# Patient Record
Sex: Female | Born: 2000
Health system: Southern US, Community
[De-identification: ages and names within clinical notes are randomized; demographics above are authoritative.]

## PROBLEM LIST (undated history)

## (undated) DIAGNOSIS — N390 Urinary tract infection, site not specified: Secondary | ICD-10-CM

## (undated) DIAGNOSIS — N926 Irregular menstruation, unspecified: Secondary | ICD-10-CM

## (undated) DIAGNOSIS — F419 Anxiety disorder, unspecified: Secondary | ICD-10-CM

## (undated) DIAGNOSIS — G43109 Migraine with aura, not intractable, without status migrainosus: Secondary | ICD-10-CM

## (undated) DIAGNOSIS — F32A Depression, unspecified: Secondary | ICD-10-CM

## (undated) HISTORY — PX: WISDOM TOOTH EXTRACTION: SHX21

## (undated) HISTORY — DX: Anxiety disorder, unspecified: F41.9

## (undated) HISTORY — DX: Irregular menstruation, unspecified: N92.6

## (undated) HISTORY — DX: Migraine with aura, not intractable, without status migrainosus: G43.109

## (undated) HISTORY — DX: Urinary tract infection, site not specified: N39.0

## (undated) HISTORY — DX: Depression, unspecified: F32.A

---
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
## 2021-01-01 ENCOUNTER — Ambulatory Visit (INDEPENDENT_AMBULATORY_CARE_PROVIDER_SITE_OTHER): Payer: 59 | Admitting: Obstetrics and Gynecology

## 2021-01-01 ENCOUNTER — Encounter: Payer: Self-pay | Admitting: Obstetrics and Gynecology

## 2021-01-01 ENCOUNTER — Other Ambulatory Visit: Payer: Self-pay

## 2021-01-01 VITALS — BP 108/80 | Ht 65.0 in | Wt 133.0 lb

## 2021-01-01 DIAGNOSIS — Z3009 Encounter for other general counseling and advice on contraception: Secondary | ICD-10-CM | POA: Diagnosis not present

## 2021-01-01 DIAGNOSIS — R350 Frequency of micturition: Secondary | ICD-10-CM | POA: Diagnosis not present

## 2021-01-01 LAB — POCT URINALYSIS DIPSTICK
Bilirubin, UA: NEGATIVE
Blood, UA: NEGATIVE
Glucose, UA: NEGATIVE
Ketones, UA: NEGATIVE
Leukocytes, UA: NEGATIVE
Nitrite, UA: NEGATIVE
Protein, UA: NEGATIVE
Spec Grav, UA: 1.02 (ref 1.010–1.025)
pH, UA: 6 (ref 5.0–8.0)

## 2021-01-04 LAB — URINE CULTURE

## 2021-01-05 MED ORDER — NITROFURANTOIN MONOHYD MACRO 100 MG PO CAPS: 100.0000 mg | ORAL_CAPSULE | Freq: Two times a day (BID) | ORAL | 0 refills | Status: AC

## 2021-01-05 NOTE — Progress Notes (Signed)
Pls let pt know C&S shows UTI, Rx macrobid eRxd. Thx.

## 2021-01-05 NOTE — Progress Notes (Signed)
Pt aware.

## 2021-01-05 NOTE — Progress Notes (Signed)
Called pt, no answer, LVMTRC. 

## 2021-01-05 NOTE — Addendum Note (Signed)
Addended by: Althea Grimmer B on: 01/05/2021 01:58 PM   Modules accepted: Orders

## 2021-12-06 ENCOUNTER — Other Ambulatory Visit: Payer: Self-pay

## 2021-12-06 ENCOUNTER — Encounter: Payer: Self-pay | Admitting: Obstetrics and Gynecology

## 2021-12-06 ENCOUNTER — Other Ambulatory Visit (HOSPITAL_COMMUNITY)
Admission: RE | Admit: 2021-12-06 | Discharge: 2021-12-06 | Disposition: A | Discharge status: Home / Self Care | Payer: 59 | Source: Ambulatory Visit | Attending: Obstetrics and Gynecology | Admitting: Obstetrics and Gynecology

## 2021-12-06 ENCOUNTER — Ambulatory Visit (INDEPENDENT_AMBULATORY_CARE_PROVIDER_SITE_OTHER): Payer: 59 | Admitting: Obstetrics and Gynecology

## 2021-12-06 VITALS — BP 100/60 | Ht 65.0 in | Wt 136.0 lb

## 2021-12-06 DIAGNOSIS — N3001 Acute cystitis with hematuria: Secondary | ICD-10-CM

## 2021-12-06 DIAGNOSIS — Z113 Encounter for screening for infections with a predominantly sexual mode of transmission: Secondary | ICD-10-CM

## 2021-12-06 DIAGNOSIS — R102 Pelvic and perineal pain: Secondary | ICD-10-CM

## 2021-12-06 DIAGNOSIS — Z30011 Encounter for initial prescription of contraceptive pills: Secondary | ICD-10-CM

## 2021-12-06 LAB — POCT URINALYSIS DIPSTICK
Bilirubin, UA: NEGATIVE
Glucose, UA: NEGATIVE
Ketones, UA: NEGATIVE
Nitrite, UA: NEGATIVE
Protein, UA: NEGATIVE
Spec Grav, UA: 1.02 (ref 1.010–1.025)
pH, UA: 5 (ref 5.0–8.0)

## 2021-12-06 MED ORDER — NITROFURANTOIN MONOHYD MACRO 100 MG PO CAPS: 100.0000 mg | ORAL_CAPSULE | Freq: Two times a day (BID) | ORAL | 0 refills | Status: AC

## 2021-12-06 MED ORDER — NORETHINDRONE 0.35 MG PO TABS: 1.0000 | ORAL_TABLET | Freq: Every day | ORAL | 3 refills | Status: DC

## 2021-12-06 NOTE — Progress Notes (Signed)
? ? ?[facility], [facility] ? ? ?Chief Complaint  ?Patient presents with  ? Pelvic Pain  ?  Some pain in pelvic, mainly abdominal started Monday  ? Urinary Tract Infection  ?  Urgency/Incontinence x 2 weeks, no burning.  ? ? ?HPI: ?     Ms. Jamie Collins is a 21 y.o. G0P0000 whose LMP was Patient's last menstrual period was 12/03/2021 (exact date)., presents today for severe pelvic pain that started 3 days ago with her period. Pain was  stabbing, constant, not improved with NSAIDs/heating pad, but is improved today. Never had this before with menses. Pain was in suprapubic area and bilat flanks radiating to BLQ. No fever/chills. Hx of chronic LBP but this was different. Pt also with urinary urgency/frequency with some incontinence for past 2 wks, worse now. No dysuria, no hematuria prior to menses starting. No vag sx. Drinking a lot of caffeine. Hx of UTIs, with E coli 3/22 on C&S.  ?She is sex active, no pain/bleeding. No BC, no condoms. Did OCPs in past with mood changes and longer periods. Didn't like nexplanon. Has hx of migraines with aura, no hx of HTN, DVTs. Is interested in [facility] Due for STD testing.  ? ?Past Medical History:  ?Diagnosis Date  ? Anxiety   ? Depression   ? Irregular menses   ? Migraine with aura   ? UTI (urinary tract infection)   ? ? ?Past Surgical History:  ?Procedure Laterality Date  ? WISDOM TOOTH EXTRACTION    ? ? ?Family History  ?Problem Relation Age of Onset  ? Diabetes Paternal Grandfather   ? Cancer Maternal Great-grandmother   ?     not sure type of cancer  ? ? ?Social History  ? ?Socioeconomic History  ? Marital status: Single  ?  Spouse name: Not on file  ? Number of children: Not on file  ? Years of education: Not on file  ? Highest education level: Not on file  ?Occupational History  ? Not on file  ?Tobacco Use  ? Smoking status: Never  ? Smokeless tobacco: Never  ?Vaping Use  ? Vaping Use: Never used  ?Substance and Sexual Activity  ? Alcohol use: No  ? Drug use: No  ?  Sexual activity: Yes  ?  Birth control/protection: None  ?Other Topics Concern  ? Not on file  ?Social History Narrative  ? Not on file  ? ?Social Determinants of Health  ? ?Financial Resource Strain: Not on file  ?Food Insecurity: Not on file  ?Transportation Needs: Not on file  ?Physical Activity: Not on file  ?Stress: Not on file  ?Social Connections: Not on file  ?Intimate Partner Violence: Not on file  ? ? ?Outpatient Medications Prior to Visit  ?Medication Sig Dispense Refill  ? buPROPion (WELLBUTRIN XL) 300 MG 24 hr tablet Take 300 mg by mouth daily.    ? meloxicam (MOBIC) 15 MG tablet Take 15 mg by mouth daily.    ? ?No facility-administered medications prior to visit.  ? ? ? ? ?ROS: ? ?Review of Systems  ?Constitutional:  Negative for fever.  ?Gastrointestinal:  Negative for blood in stool, constipation, diarrhea, nausea and vomiting.  ?Genitourinary:  Positive for flank pain, frequency, pelvic pain and urgency. Negative for dyspareunia, dysuria, hematuria, vaginal bleeding, vaginal discharge and vaginal pain.  ?Musculoskeletal:  Positive for back pain.  ?Skin:  Negative for rash.  ?BREAST: No symptoms ? ? ?OBJECTIVE:  ? ?Vitals:  ?BP 100/60   Ht 5'  5" (1.651 m)   Wt 136 lb (61.7 kg)   LMP 12/03/2021 (Exact Date)   BMI 22.63 kg/m?  ? ?Physical Exam ?Vitals reviewed.  ?Constitutional:   ?   Appearance: She is well-developed. She is not ill-appearing or toxic-appearing.  ?Pulmonary:  ?   Effort: Pulmonary effort is normal.  ?Abdominal:  ?   Palpations: Abdomen is soft.  ?   Tenderness: There is no abdominal tenderness. There is no right CVA tenderness or left CVA tenderness.  ?Genitourinary: ?   General: Normal vulva.  ?   Pubic Area: No rash.   ?   Labia:     ?   Right: No rash, tenderness or lesion.     ?   Left: No rash, tenderness or lesion.   ?   Vagina: Normal. No vaginal discharge, erythema or tenderness.  ?   Cervix: No cervical motion tenderness.  ?   Uterus: Normal. Not enlarged and not tender.    ?   Adnexa: Right adnexa normal and left adnexa normal.    ?   Right: No mass or tenderness.      ?   Left: No mass or tenderness.    ?Musculoskeletal:     ?   General: Normal range of motion.  ?   Cervical back: Normal range of motion.  ?Skin: ?   General: Skin is warm and dry.  ?Neurological:  ?   General: No focal deficit present.  ?   Mental Status: She is alert and oriented to person, place, and time.  ?   Cranial Nerves: No cranial nerve deficit.  ?Psychiatric:     ?   Mood and Affect: Mood normal.     ?   Behavior: Behavior normal.     ?   Thought Content: Thought content normal.     ?   Judgment: Judgment normal.  ? ? ?Results: ?Results for orders placed or performed in visit on 12/06/21 (from the past 24 hour(s))  ?POCT Urinalysis Dipstick     Status: Abnormal  ? Collection Time: 12/06/21 10:49 AM  ?Result Value Ref Range  ? Color, UA yellow   ? Clarity, UA cloudy   ? Glucose, UA Negative Negative  ? Bilirubin, UA neg   ? Ketones, UA neg   ? Spec Grav, UA 1.020 1.010 - 1.025  ? Blood, UA large   ? pH, UA 5.0 5.0 - 8.0  ? Protein, UA Negative Negative  ? Urobilinogen, UA    ? Nitrite, UA neg   ? Leukocytes, UA Large (3+) (A) Negative  ? Appearance    ? Odor    ? ?PT WITH MENSTRUAL BLEEDING BUT VERY FAINT ON EXAM; HEMATURIA PROB UTI RELATED ? ?Assessment/Plan: ?Pelvic pain - Plan: Urine Culture; with menses, sx improving. Neg exam. Rule out STDs, check C&S, treat for UTI. If neg and sx persist/recur, will check GYN u/s.  ? ?Screening for STD (sexually transmitted disease) - Plan: Cervicovaginal ancillary only ? ?Acute cystitis with hematuria - Plan: nitrofurantoin, macrocrystal-monohydrate, (MACROBID) 100 MG capsule, POCT Urinalysis Dipstick, Urine Culture; pos sx and UA. Check C&S. Rx macrobid. D/C caffeine, f/u prn.  ? ?Encounter for initial prescription of contraceptive pills - Plan: norethindrone (MICRONOR) 0.35 MG tablet; prog only options discussed. Pt would like to try POPs. Rx eRxd, condoms for 1 mo  and it more than 3 hrs late taking Rx.  ? ? ?Meds ordered this encounter  ?Medications  ? nitrofurantoin, macrocrystal-monohydrate, (MACROBID) 100  MG capsule  ?  Sig: Take 1 capsule (100 mg total) by mouth 2 (two) times daily for 5 days.  ?  Dispense:  10 capsule  ?  Refill:  0  ?  Order Specific Question:   Supervising Provider  ?  AnswerNadara Mustard [[id number]]  ? norethindrone (MICRONOR) 0.35 MG tablet  ?  Sig: Take 1 tablet (0.35 mg total) by mouth daily.  ?  Dispense:  84 tablet  ?  Refill:  3  ?  Order Specific Question:   Supervising Provider  ?  AnswerNadara Mustard [[id number]]  ? ? ? ? Return if symptoms worsen or fail to improve. ? ?Severin Bou B. Daron Breeding, PA-C ?12/06/2021 ?10:53 AM ? ? ? ? ? ?

## 2021-12-06 NOTE — Patient Instructions (Signed)
I value your feedback and you entrusting us with your care. If you get a [facility] patient survey, I would appreciate you taking the time to let us know about your experience today. Thank you! ? ? ?

## 2021-12-07 LAB — CERVICOVAGINAL ANCILLARY ONLY
Chlamydia: NEGATIVE
Comment: NEGATIVE
Comment: NORMAL
Neisseria Gonorrhea: NEGATIVE

## 2021-12-10 LAB — URINE CULTURE

## 2022-10-03 DIAGNOSIS — Z3200 Encounter for pregnancy test, result unknown: Secondary | ICD-10-CM | POA: Insufficient documentation

## 2022-10-03 NOTE — Progress Notes (Cosign Needed)
    NURSE VISIT NOTE  Subjective:    Patient ID: Jamie Collins, female    DOB: [birth year], 21 y.o.   MRN: [id number]  HPI  Patient is a 21 y.o. G0P0000 female who presents for evaluation of amenorrhea. She believes she could be pregnant. Current symptoms also include: nausea. Last period was abnormal spoting 08/16/2022 but unsure     Objective:    BP 117/78   Pulse 82   Ht 5\' 5"  (1.651 m)   Wt 133 lb (60.3 kg)   LMP 08/16/2022   BMI 22.13 kg/m   Lab Review  Results for orders placed or performed in visit on 10/04/22  POCT urine pregnancy  Result Value Ref Range   Preg Test, Ur Positive (A) Negative    Assessment:   1. Possible pregnancy, not confirmed     Plan:   Pregnancy Test: Positive      10/06/22, CMA

## 2022-10-04 ENCOUNTER — Ambulatory Visit (INDEPENDENT_AMBULATORY_CARE_PROVIDER_SITE_OTHER): Payer: Self-pay

## 2022-10-04 VITALS — BP 117/78 | HR 82 | Ht 65.0 in | Wt 133.0 lb

## 2022-10-04 DIAGNOSIS — Z3201 Encounter for pregnancy test, result positive: Secondary | ICD-10-CM

## 2022-10-04 DIAGNOSIS — Z3200 Encounter for pregnancy test, result unknown: Secondary | ICD-10-CM

## 2022-10-04 LAB — POCT URINE PREGNANCY: Preg Test, Ur: POSITIVE — AB

## 2022-10-07 NOTE — L&D Delivery Note (Signed)
Delivery Note  Jamie Collins is a G1P1001 at [redacted]w[redacted]d with an LMP of 08/16/22, consistent with [place] at [redacted]w[redacted]d.   First Stage: Labor onset: 1352 Induction: misoprostol, oxytocin, AROM, and cervical balloon Analgesia /Anesthesia intrapartum: Epidural AROM at 1452 GBS: negative IP Antibiotics: none  Second Stage: Complete dilation at 2316 Onset of pushing at 2326 FHR second stage 125 bpm with moderate variability, variable decels with pushing   Jamie Collins presented to L&D with elective IOL. She was 1.5/50--3. She progressed  to C/C/+2 with a spontaneous urge to push.  She pushed  effectively over approximately 20 minutes for a spontaneous vaginal birth.  Delivery of a viable baby boy on 05/24/23 at 2346 by CNM Delivery of fetal head in OA position with restitution to LOT. no nuchal cord;  Anterior then posterior shoulders delivered easily with gentle downward traction. Baby placed on mom's chest, and attended to by baby RN Cord double clamped after cessation of pulsation, cut by FOB  Cord blood sample collection: Yes O POS Performed at [facility], [address] [place], [place] [address]  Collection of cord blood donation n/a Arterial cord blood sample none  Third Stage: Oxytocin bolus started after delivery of infant for hemorrhage prophylaxis  Placenta delivered [name] intact with 3 VC @ 2354 Placenta disposition: discarded per protocol Uterine tone firm / bleeding moderate  2 nd vaginal, 1st bilateral labial laceration identified  Anesthesia for repair: epidural Repair 2-0, 3-0 vicryl Est. Blood Loss (mL): 300  Complications: none  Mom to postpartum.  Baby to Couplet care / Skin to Skin.  Newborn: Information for the patient's newborn:  [name], [name] [[id number]]  Live born female "Jamie Collins" Birth Weight:   APGAR: 9, 9  Newborn Delivery   Birth date/time: 05/24/2023 23:46:00 Delivery type: Vaginal, Spontaneous      Feeding planned: breast  feeding  ---------- Chari Manning, CNM Certified Nurse Midwife [facility]

## 2022-10-09 ENCOUNTER — Ambulatory Visit: Payer: Self-pay

## 2022-10-10 ENCOUNTER — Telehealth: Payer: Self-pay

## 2022-10-10 NOTE — Telephone Encounter (Signed)
Pt calling; has NOB Intake appt tomorrow; knows she needs to pay up front; wants to know how much she will need to pay?  [phone]

## 2022-10-10 NOTE — Telephone Encounter (Signed)
Touched base with pt to tell her a msg was sent about up front cost but haven't received a response yet; tomorrow's appt will be 'no charge'.  Pt appreciative.

## 2022-10-11 ENCOUNTER — Ambulatory Visit (INDEPENDENT_AMBULATORY_CARE_PROVIDER_SITE_OTHER): Payer: Self-pay

## 2022-10-11 VITALS — Wt 133.0 lb

## 2022-10-11 DIAGNOSIS — Z369 Encounter for antenatal screening, unspecified: Secondary | ICD-10-CM

## 2022-10-11 DIAGNOSIS — Z3689 Encounter for other specified antenatal screening: Secondary | ICD-10-CM

## 2022-10-11 DIAGNOSIS — Z3480 Encounter for supervision of other normal pregnancy, unspecified trimester: Secondary | ICD-10-CM | POA: Insufficient documentation

## 2022-10-11 NOTE — Progress Notes (Signed)
New OB Intake  I connected with  Jamie Collins on 10/11/22 at 11:15 AM EST by telephone and verified that I am speaking with the correct person using two identifiers. Nurse is located at [employer] and pt is located at home.  I explained I am completing New OB Intake today. We discussed her EDD of 05/23/2023 that is based on LMP of 08/16/2022. Pt is G1/P0. I reviewed her allergies, medications, Medical/Surgical/OB history, and appropriate screenings. There are cats in the home  no.  Based on history, this is a/an pregnancy uncomplicated .   Patient Active Problem List   Diagnosis Date Noted   Supervision of other normal pregnancy, antepartum 10/11/2022   Possible pregnancy, not confirmed 10/03/2022    Concerns addressed today Questions answered to pt satisfaction regarding hair color, exercise, baths, safe meds.   Delivery Plans:  Plans to deliver at  [facility].  Anatomy US Explained first scheduled [place] will be 1/31st and an anatomy scan will be done at 20 weeks.  Labs Discussed genetic screening with patient. Patient desires genetic testing to be drawn with new OB labs. Discussed possible labs to be drawn at new OB appointment.  COVID Vaccine Patient has had COVID vaccine.   Social Determinants of Health Food Insecurity: denies food insecurity Transportation: Patient denies transportation needs.  First visit review I reviewed new OB appt with pt. I explained she will have ob bloodwork and pap smear/pelvic exam if indicated. Explained pt will be seen by Dr. Mayo Ao at first visit; encounter routed to appropriate provider.   Cleophas Dunker, [place] 10/11/2022  11:45 AM

## 2022-10-11 NOTE — Patient Instructions (Signed)
First Trimester of Pregnancy  The first trimester of pregnancy starts on the first day of your last menstrual period until the end of week 12. This is also called months 1 through 3 of pregnancy. Body changes during your first trimester Your body goes through many changes during pregnancy. The changes usually return to normal after your baby is born. Physical changes You may gain or lose weight. Your breasts may grow larger and hurt. The area around your nipples may get darker. Dark spots or blotches may develop on your face. You may have changes in your hair. Health changes You may feel like you might vomit (nauseous), and you may vomit. You may have heartburn. You may have headaches. You may have trouble pooping (constipation). Your gums may bleed. Other changes You may get tired easily. You may pee (urinate) more often. Your menstrual periods will stop. You may not feel hungry. You may want to eat certain kinds of food. You may have changes in your emotions from day to day. You may have more dreams. Follow these instructions at home: Medicines Take over-the-counter and prescription medicines only as told by your doctor. Some medicines are not safe during pregnancy. Take a prenatal vitamin that contains at least 600 micrograms (mcg) of folic acid. Eating and drinking Eat healthy meals that include: Fresh fruits and vegetables. Whole grains. Good sources of protein, such as meat, eggs, or tofu. Low-fat dairy products. Avoid raw meat and unpasteurized juice, milk, and cheese. If you feel like you may vomit, or you vomit: Eat 4 or 5 small meals a day instead of 3 large meals. Try eating a few soda crackers. Drink liquids between meals instead of during meals. You may need to take these actions to prevent or treat trouble pooping: Drink enough fluids to keep your pee (urine) pale yellow. Eat foods that are high in fiber. These include beans, whole grains, and fresh fruits and  vegetables. Limit foods that are high in fat and sugar. These include fried or sweet foods. Activity Exercise only as told by your doctor. Most people can do their usual exercise routine during pregnancy. Stop exercising if you have cramps or pain in your lower belly (abdomen) or low back. Do not exercise if it is too hot or too humid, or if you are in a place of great height (high altitude). Avoid heavy lifting. If you choose to, you may have sex unless your doctor tells you not to. Relieving pain and discomfort Wear a good support bra if your breasts are sore. Rest with your legs raised (elevated) if you have leg cramps or low back pain. If you have bulging veins (varicose veins) in your legs: Wear support hose as told by your doctor. Raise your feet for 15 minutes, 3-4 times a day. Limit salt in your food. Safety Wear your seat belt at all times when you are in a car. Talk with your doctor if someone is hurting you or yelling at you. Talk with your doctor if you are feeling sad or have thoughts of hurting yourself. Lifestyle Do not use hot tubs, steam rooms, or saunas. Do not douche. Do not use tampons or scented sanitary pads. Do not use herbal medicines, illegal drugs, or medicines that are not approved by your doctor. Do not drink alcohol. Do not smoke or use any products that contain nicotine or tobacco. If you need help quitting, ask your doctor. Avoid cat litter boxes and soil that is used by cats. These carry   germs that can cause harm to the baby and can cause a loss of your baby by miscarriage or stillbirth. General instructions Keep all follow-up visits. This is important. Ask for help if you need counseling or if you need help with nutrition. Your doctor can give you advice or tell you where to go for help. Visit your dentist. At home, brush your teeth with a soft toothbrush. Floss gently. Write down your questions. Take them to your prenatal visits. Where to find more  information American Pregnancy Association: americanpregnancy.org American College of Obstetricians and Gynecologists: www.acog.org Office on Women's Health: womenshealth.gov/pregnancy Contact a doctor if: You are dizzy. You have a fever. You have mild cramps or pressure in your lower belly. You have a nagging pain in your belly area. You continue to feel like you may vomit, you vomit, or you have watery poop (diarrhea) for 24 hours or longer. You have a bad-smelling fluid coming from your vagina. You have pain when you pee. You are exposed to a disease that spreads from person to person, such as chickenpox, measles, Zika virus, HIV, or hepatitis. Get help right away if: You have spotting or bleeding from your vagina. You have very bad belly cramping or pain. You have shortness of breath or chest pain. You have any kind of injury, such as from a fall or a car crash. You have new or increased pain, swelling, or redness in an arm or leg. Summary The first trimester of pregnancy starts on the first day of your last menstrual period until the end of week 12 (months 1 through 3). Eat 4 or 5 small meals a day instead of 3 large meals. Do not smoke or use any products that contain nicotine or tobacco. If you need help quitting, ask your doctor. Keep all follow-up visits. This information is not intended to replace advice given to you by your health care provider. Make sure you discuss any questions you have with your health care provider. Document Revised: 03/01/2020 Document Reviewed: 01/06/2020 Elsevier Patient Education  2023 Elsevier Inc. Commonly Asked Questions During Pregnancy  Cats: A parasite can be excreted in cat feces.  To avoid exposure you need to have another person empty the little box.  If you must empty the litter box you will need to wear gloves.  Wash your hands after handling your cat.  This parasite can also be found in raw or undercooked meat so this should also be  avoided.  Colds, Sore Throats, Flu: Please check your medication sheet to see what you can take for symptoms.  If your symptoms are unrelieved by these medications please call the office.  Dental Work: Most any dental work your dentist recommends is permitted.  X-rays should only be taken during the first trimester if absolutely necessary.  Your abdomen should be shielded with a lead apron during all x-rays.  Please notify your provider prior to receiving any x-rays.  Novocaine is fine; gas is not recommended.  If your dentist requires a note from us prior to dental work please call the office and we will provide one for you.  Exercise: Exercise is an important part of staying healthy during your pregnancy.  You may continue most exercises you were accustomed to prior to pregnancy.  Later in your pregnancy you will most likely notice you have difficulty with activities requiring balance like riding a bicycle.  It is important that you listen to your body and avoid activities that put you at a higher   risk of falling.  Adequate rest and staying well hydrated are a must!  If you have questions about the safety of specific activities ask your provider.    Exposure to Children with illness: Try to avoid obvious exposure; report any symptoms to us when noted,  If you have chicken pos, red measles or mumps, you should be immune to these diseases.   Please do not take any vaccines while pregnant unless you have checked with your OB provider.  Fetal Movement: After 28 weeks we recommend you do "kick counts" twice daily.  Lie or sit down in a calm quiet environment and count your baby movements "kicks".  You should feel your baby at least 10 times per hour.  If you have not felt 10 kicks within the first hour get up, walk around and have something sweet to eat or drink then repeat for an additional hour.  If count remains less than 10 per hour notify your provider.  Fumigating: Follow your pest control agent's  advice as to how long to stay out of your home.  Ventilate the area well before re-entering.  Hemorrhoids:   Most over-the-counter preparations can be used during pregnancy.  Check your medication to see what is safe to use.  It is important to use a stool softener or fiber in your diet and to drink lots of liquids.  If hemorrhoids seem to be getting worse please call the office.   Hot Tubs:  Hot tubs Jacuzzis and saunas are not recommended while pregnant.  These increase your internal body temperature and should be avoided.  Intercourse:  Sexual intercourse is safe during pregnancy as long as you are comfortable, unless otherwise advised by your provider.  Spotting may occur after intercourse; report any bright red bleeding that is heavier than spotting.  Labor:  If you know that you are in labor, please go to the hospital.  If you are unsure, please call the office and let us help you decide what to do.  Lifting, straining, etc:  If your job requires heavy lifting or straining please check with your provider for any limitations.  Generally, you should not lift items heavier than that you can lift simply with your hands and arms (no back muscles)  Painting:  Paint fumes do not harm your pregnancy, but may make you ill and should be avoided if possible.  Latex or water based paints have less odor than oils.  Use adequate ventilation while painting.  Permanents & Hair Color:  Chemicals in hair dyes are not recommended as they cause increase hair dryness which can increase hair loss during pregnancy.  " Highlighting" and permanents are allowed.  Dye may be absorbed differently and permanents may not hold as well during pregnancy.  Sunbathing:  Use a sunscreen, as skin burns easily during pregnancy.  Drink plenty of fluids; avoid over heating.  Tanning Beds:  Because their possible side effects are still unknown, tanning beds are not recommended.  Ultrasound Scans:  Routine ultrasounds are performed  at approximately 20 weeks.  You will be able to see your baby's general anatomy an if you would like to know the gender this can usually be determined as well.  If it is questionable when you conceived you may also receive an ultrasound early in your pregnancy for dating purposes.  Otherwise ultrasound exams are not routinely performed unless there is a medical necessity.  Although you can request a scan we ask that you pay for it when   conducted because insurance does not cover " patient request" scans.  Work: If your pregnancy proceeds without complications you may work until your due date, unless your physician or employer advises otherwise.  Round Ligament Pain/Pelvic Discomfort:  Sharp, shooting pains not associated with bleeding are fairly common, usually occurring in the second trimester of pregnancy.  They tend to be worse when standing up or when you remain standing for long periods of time.  These are the result of pressure of certain pelvic ligaments called "round ligaments".  Rest, Tylenol and heat seem to be the most effective relief.  As the womb and fetus grow, they rise out of the pelvis and the discomfort improves.  Please notify the office if your pain seems different than that described.  It may represent a more serious condition.  Common Medications Safe in Pregnancy  Acne:      Constipation:  Benzoyl Peroxide     Colace  Clindamycin      Dulcolax Suppository  Topica Erythromycin     Fibercon  Salicylic Acid      Metamucil         Miralax AVOID:        Senakot   Accutane    Cough:  Retin-A       Cough Drops  Tetracycline      Phenergan w/ Codeine if Rx  Minocycline      Robitussin (Plain & DM)  Antibiotics:     Crabs/Lice:  Ceclor       RID  Cephalosporins    AVOID:  E-Mycins      Kwell  Keflex  Macrobid/Macrodantin   Diarrhea:  Penicillin      Kao-Pectate  Zithromax      Imodium AD         PUSH FLUIDS AVOID:       Cipro     Fever:  Tetracycline      Tylenol (Regular  or Extra  Minocycline       Strength)  Levaquin      Extra Strength-Do not          Exceed 8 tabs/24 hrs Caffeine:        <200mg/day (equiv. To 1 cup of coffee or  approx. 3 12 oz sodas)         Gas: Cold/Hayfever:       Gas-X  Benadryl      Mylicon  Claritin       Phazyme  **Claritin-D        Chlor-Trimeton    Headaches:  Dimetapp      ASA-Free Excedrin  Drixoral-Non-Drowsy     Cold Compress  Mucinex (Guaifenasin)     Tylenol (Regular or Extra  Sudafed/Sudafed-12 Hour     Strength)  **Sudafed PE Pseudoephedrine   Tylenol Cold & Sinus     Vicks Vapor Rub  Zyrtec  **AVOID if Problems With Blood Pressure         Heartburn: Avoid lying down for at least 1 hour after meals  Aciphex      Maalox     Rash:  Milk of Magnesia     Benadryl    Mylanta       1% Hydrocortisone Cream  Pepcid  Pepcid Complete   Sleep Aids:  Prevacid      Ambien   Prilosec       Benadryl  Rolaids       Chamomile Tea  Tums (Limit 4/day)     Unisom           Tylenol PM         Warm milk-add vanilla or  Hemorrhoids:       Sugar for taste  Anusol/Anusol H.C.  (RX: Analapram 2.5%)  Sugar Substitutes:  Hydrocortisone OTC     Ok in moderation  Preparation H      Tucks        Vaseline lotion applied to tissue with wiping    Herpes:     Throat:  Acyclovir      Oragel  Famvir  Valtrex     Vaccines:         Flu Shot Leg Cramps:       *Gardasil  Benadryl      Hepatitis A         Hepatitis B Nasal Spray:       Pneumovax  Saline Nasal Spray     Polio Booster         Tetanus Nausea:       Tuberculosis test or PPD  Vitamin B6 25 mg TID   AVOID:    Dramamine      *Gardasil  Emetrol       Live Poliovirus  Ginger Root 250 mg QID    MMR (measles, mumps &  High Complex Carbs @ Bedtime    rebella)  Sea Bands-Accupressure    Varicella (Chickenpox)  Unisom 1/2 tab TID     *No known complications           If received before Pain:         Known pregnancy;   Darvocet       Resume series  after  Lortab        Delivery  Percocet    Yeast:   Tramadol      Femstat  Tylenol 3      Gyne-lotrimin  Ultram       Monistat  Vicodin           MISC:         All Sunscreens           Hair Coloring/highlights          Insect Repellant's          (Including DEET)         Mystic Tans  

## 2022-10-15 ENCOUNTER — Telehealth: Payer: Self-pay

## 2022-10-15 NOTE — Telephone Encounter (Signed)
Pt calling; is having stomach pain; what to do?  [phone]  Pt states pain started late last night; couldn't eat, no cramping or bleeding; hurts to move; sort of around belly button.  Adv may take two e.s. tylenol q6h while awake and apply heat 20 minutes out of each hour while awake. If pain gets worse to be seen. Pt appreciative.

## 2022-10-22 ENCOUNTER — Telehealth: Payer: Self-pay

## 2022-10-22 MED ORDER — DOXYLAMINE-PYRIDOXINE 10-10 MG PO TBEC: 2.0000 | DELAYED_RELEASE_TABLET | ORAL | 1 refills | Status: AC

## 2022-10-22 NOTE — Telephone Encounter (Signed)
Pt calling; her nausea is worse; has tried the suggestions from the nurse; can something be rx'd?  [phone]  Adv pt will rx diglegis.

## 2022-10-23 NOTE — Telephone Encounter (Signed)
Pt calling; has picked up the medication but is still very nauseas today; would it be possible to get a doctor's note for being out of work yesterday and today?  [phone]

## 2022-11-06 ENCOUNTER — Other Ambulatory Visit: Payer: Self-pay

## 2022-11-07 ENCOUNTER — Ambulatory Visit
Admission: RE | Admit: 2022-11-07 | Discharge: 2022-11-07 | Disposition: A | Discharge status: Home / Self Care | Payer: BC Managed Care – PPO | Source: Ambulatory Visit | Attending: Obstetrics and Gynecology | Admitting: Obstetrics and Gynecology

## 2022-11-07 ENCOUNTER — Other Ambulatory Visit (HOSPITAL_COMMUNITY)
Admission: RE | Admit: 2022-11-07 | Discharge: 2022-11-07 | Disposition: A | Discharge status: Home / Self Care | Payer: BC Managed Care – PPO | Source: Ambulatory Visit | Attending: Obstetrics and Gynecology | Admitting: Obstetrics and Gynecology

## 2022-11-07 ENCOUNTER — Other Ambulatory Visit: Payer: Self-pay

## 2022-11-07 DIAGNOSIS — Z3481 Encounter for supervision of other normal pregnancy, first trimester: Secondary | ICD-10-CM | POA: Diagnosis not present

## 2022-11-07 DIAGNOSIS — Z369 Encounter for antenatal screening, unspecified: Secondary | ICD-10-CM | POA: Insufficient documentation

## 2022-11-07 DIAGNOSIS — Z3480 Encounter for supervision of other normal pregnancy, unspecified trimester: Secondary | ICD-10-CM

## 2022-11-07 DIAGNOSIS — Z3A12 12 weeks gestation of pregnancy: Secondary | ICD-10-CM | POA: Insufficient documentation

## 2022-11-08 LAB — CBC/D/PLT+RPR+RH+ABO+RUBIGG...
Antibody Screen: NEGATIVE
Basophils Absolute: 0.1 10*3/uL (ref 0.0–0.2)
Basos: 1 %
EOS (ABSOLUTE): 0.1 10*3/uL (ref 0.0–0.4)
Eos: 1 %
HCV Ab: NONREACTIVE
HIV Screen 4th Generation wRfx: NONREACTIVE
Hematocrit: 38.1 % (ref 34.0–46.6)
Hemoglobin: 13.3 g/dL (ref 11.1–15.9)
Hepatitis B Surface Ag: NEGATIVE
Immature Grans (Abs): 0.1 10*3/uL (ref 0.0–0.1)
Immature Granulocytes: 1 %
Lymphocytes Absolute: 1.3 10*3/uL (ref 0.7–3.1)
Lymphs: 12 %
MCH: 32 pg (ref 26.6–33.0)
MCHC: 34.9 g/dL (ref 31.5–35.7)
MCV: 92 fL (ref 79–97)
Monocytes Absolute: 0.8 10*3/uL (ref 0.1–0.9)
Monocytes: 7 %
Neutrophils Absolute: 8.5 10*3/uL — ABNORMAL HIGH (ref 1.4–7.0)
Neutrophils: 78 %
Platelets: 267 10*3/uL (ref 150–450)
RBC: 4.16 x10E6/uL (ref 3.77–5.28)
RDW: 14.3 % (ref 11.7–15.4)
RPR Ser Ql: NONREACTIVE
Rh Factor: POSITIVE
Rubella Antibodies, IGG: 4.15 index (ref >0.99)
Varicella zoster IgG: 439 index (ref >165)
WBC: 10.8 10*3/uL (ref 3.4–10.8)

## 2022-11-08 LAB — URINALYSIS, ROUTINE W REFLEX MICROSCOPIC
Bilirubin, UA: NEGATIVE
Glucose, UA: NEGATIVE
Ketones, UA: NEGATIVE
Nitrite, UA: NEGATIVE
Protein,UA: NEGATIVE
RBC, UA: NEGATIVE
Specific Gravity, UA: 1.01 (ref 1.005–1.030)
Urobilinogen, Ur: 0.2 mg/dL (ref 0.2–1.0)
pH, UA: 6 (ref 5.0–7.5)

## 2022-11-08 LAB — MICROSCOPIC EXAMINATION
Bacteria, UA: NONE SEEN
Casts: NONE SEEN /lpf
RBC, Urine: NONE SEEN /hpf (ref 0–2)

## 2022-11-08 LAB — HCV INTERPRETATION

## 2022-11-09 LAB — MONITOR DRUG PROFILE 14(MW)
Amphetamine Scrn, Ur: NEGATIVE ng/mL
BARBITURATE SCREEN URINE: NEGATIVE ng/mL
BENZODIAZEPINE SCREEN, URINE: NEGATIVE ng/mL
Buprenorphine, Urine: NEGATIVE ng/mL
CANNABINOIDS UR QL SCN: NEGATIVE ng/mL
Cocaine (Metab) Scrn, Ur: NEGATIVE ng/mL
Creatinine(Crt), U: 47.5 mg/dL (ref 20.0–300.0)
Fentanyl, Urine: NEGATIVE pg/mL
Meperidine Screen, Urine: NEGATIVE ng/mL
Methadone Screen, Urine: NEGATIVE ng/mL
OXYCODONE+OXYMORPHONE UR QL SCN: NEGATIVE ng/mL
Opiate Scrn, Ur: NEGATIVE ng/mL
Ph of Urine: 5.4 (ref 4.5–8.9)
Phencyclidine Qn, Ur: NEGATIVE ng/mL
Propoxyphene Scrn, Ur: NEGATIVE ng/mL
SPECIFIC GRAVITY: 1.01
Tramadol Screen, Urine: NEGATIVE ng/mL

## 2022-11-09 LAB — URINE CULTURE, OB REFLEX

## 2022-11-09 LAB — NICOTINE SCREEN, URINE: Cotinine Ql Scrn, Ur: NEGATIVE ng/mL

## 2022-11-09 LAB — CULTURE, OB URINE

## 2022-11-11 LAB — URINE CYTOLOGY ANCILLARY ONLY
Chlamydia: NEGATIVE
Comment: NEGATIVE
Comment: NORMAL
Neisseria Gonorrhea: NEGATIVE

## 2022-11-12 LAB — MATERNIT 21 PLUS CORE, BLOOD
Fetal Fraction: 4
Result (T21): NEGATIVE
Trisomy 13 (Patau syndrome): NEGATIVE
Trisomy 18 (Edwards syndrome): NEGATIVE
Trisomy 21 (Down syndrome): NEGATIVE

## 2022-11-19 ENCOUNTER — Encounter: Payer: Self-pay | Admitting: Obstetrics and Gynecology

## 2022-11-19 ENCOUNTER — Ambulatory Visit (INDEPENDENT_AMBULATORY_CARE_PROVIDER_SITE_OTHER): Payer: BC Managed Care – PPO | Admitting: Obstetrics and Gynecology

## 2022-11-19 VITALS — BP 112/66 | HR 82 | Wt 135.1 lb

## 2022-11-19 DIAGNOSIS — R82998 Other abnormal findings in urine: Secondary | ICD-10-CM

## 2022-11-19 DIAGNOSIS — Z3A13 13 weeks gestation of pregnancy: Secondary | ICD-10-CM | POA: Diagnosis not present

## 2022-11-19 DIAGNOSIS — Z3401 Encounter for supervision of normal first pregnancy, first trimester: Secondary | ICD-10-CM

## 2022-11-19 DIAGNOSIS — R399 Unspecified symptoms and signs involving the genitourinary system: Secondary | ICD-10-CM

## 2022-11-19 DIAGNOSIS — Z3402 Encounter for supervision of normal first pregnancy, second trimester: Secondary | ICD-10-CM

## 2022-11-19 LAB — POCT URINALYSIS DIPSTICK OB
Bilirubin, UA: NEGATIVE
Glucose, UA: NEGATIVE
Ketones, UA: NEGATIVE
Spec Grav, UA: 1.015
Urobilinogen, UA: 0.2 U/dL
pH, UA: 7.5

## 2022-11-19 MED ORDER — NITROFURANTOIN MONOHYD MACRO 100 MG PO CAPS: 100.0000 mg | ORAL_CAPSULE | Freq: Two times a day (BID) | ORAL | 0 refills | Status: DC

## 2022-11-19 NOTE — Progress Notes (Signed)
NOB: She has occasional low back pain that she believes is from a previous sports injury.  Has no urinary symptoms but urine obviously with UTI.  Macrobid prescribed.  Has occasional nausea but she states this has dramatically improved.  Currently using B6. Consider AFP next visit -schedule anatomy ultrasound next visit for approximately 20 weeks.  Physical examination General NAD, Conversant  HEENT Atraumatic; Op clear with mmm.  Normo-cephalic. Pupils reactive. Anicteric sclerae  Thyroid/Neck Smooth without nodularity or enlargement. Normal ROM.  Neck Supple.  Skin No rashes, lesions or ulceration. Normal palpated skin turgor. No nodularity.  Breasts: No masses or discharge.  Symmetric.  No axillary adenopathy.  Lungs: Clear to auscultation.No rales or wheezes. Normal Respiratory effort, no retractions.  Heart: NSR.  No murmurs or rubs appreciated. No peripheral edema  Abdomen: Soft.  Non-tender.  No masses.  No HSM. No hernia  Extremities: Moves all appropriately.  Normal ROM for age. No lymphadenopathy.  Neuro: Oriented to PPT.  Normal mood. Normal affect.     Pelvic:   Vulva: Normal appearance.  No lesions.  Vagina: No lesions or abnormalities noted.  Support: Normal pelvic support.  Urethra No masses tenderness or scarring.  Meatus Normal size without lesions or prolapse.  Cervix: Normal appearance.  No lesions.  Anus: Normal exam.  No lesions.  Perineum: Normal exam.  No lesions.        Bimanual   Adnexae: No masses.  Non-tender to palpation.  Uterus: Enlarged. 13 wks  Non-tender.  Mobile.  AV.  Adnexae: No masses.  Non-tender to palpation.  Cul-de-sac: Negative for abnormality.  Adnexae: No masses.  Non-tender to palpation.         Pelvimetry   Diagonal: Reached.  Spines: Average.  Sacrum: Concave.  Pubic Arch: Normal.

## 2022-11-19 NOTE — Progress Notes (Signed)
Patient presents today for New OB physical. She states continuing to have nausea on and off, handout given with tips. She does state some back and side pain. Patient had genetic testing, Materniti21 negative. She states no other questions or concerns at this time.

## 2022-11-27 ENCOUNTER — Telehealth: Payer: Self-pay

## 2022-11-27 NOTE — Telephone Encounter (Signed)
Patient is 90w5dpregnant. She just started a new job at [employer] She is required to have Flu vaccine and TB Skin test. Employer requesting letter of approval for both due to patient being pregnant. Patient will receive letter thru my chart.

## 2022-12-04 DIAGNOSIS — Z3493 Encounter for supervision of normal pregnancy, unspecified, third trimester: Secondary | ICD-10-CM | POA: Insufficient documentation

## 2022-12-16 ENCOUNTER — Encounter: Payer: Medicaid Other | Admitting: Licensed Practical Nurse

## 2023-02-02 ENCOUNTER — Other Ambulatory Visit: Payer: Self-pay

## 2023-02-02 ENCOUNTER — Encounter: Payer: Self-pay | Admitting: Obstetrics and Gynecology

## 2023-02-02 ENCOUNTER — Observation Stay: Payer: Medicaid Other

## 2023-02-02 ENCOUNTER — Observation Stay
Admission: EM | Admit: 2023-02-02 | Discharge: 2023-02-02 | Disposition: A | Discharge status: Home / Self Care | Payer: Medicaid Other | Attending: Obstetrics and Gynecology | Admitting: Obstetrics and Gynecology

## 2023-02-02 DIAGNOSIS — O26852 Spotting complicating pregnancy, second trimester: Principal | ICD-10-CM | POA: Insufficient documentation

## 2023-02-02 DIAGNOSIS — O4692 Antepartum hemorrhage, unspecified, second trimester: Secondary | ICD-10-CM | POA: Diagnosis present

## 2023-02-02 DIAGNOSIS — Z3A24 24 weeks gestation of pregnancy: Secondary | ICD-10-CM | POA: Diagnosis not present

## 2023-02-02 LAB — URINALYSIS, COMPLETE (UACMP) WITH MICROSCOPIC
Bilirubin Urine: NEGATIVE
Glucose, UA: NEGATIVE mg/dL
Hgb urine dipstick: NEGATIVE
Ketones, ur: NEGATIVE mg/dL
Nitrite: NEGATIVE
Protein, ur: NEGATIVE mg/dL
Specific Gravity, Urine: 1.006 (ref 1.005–1.030)
pH: 8 (ref 5.0–8.0)

## 2023-02-02 LAB — WET PREP, GENITAL
Clue Cells Wet Prep HPF POC: NONE SEEN
Sperm: NONE SEEN
Trich, Wet Prep: NONE SEEN
WBC, Wet Prep HPF POC: >=10 — AB (ref <10)
Yeast Wet Prep HPF POC: NONE SEEN

## 2023-02-02 LAB — CHLAMYDIA/NGC RT PCR (ARMC ONLY)
Chlamydia Tr: NOT DETECTED
N gonorrhoeae: NOT DETECTED

## 2023-02-02 MED ORDER — ACETAMINOPHEN 500 MG PO TABS: 1000.0000 mg | ORAL_TABLET | Freq: Four times a day (QID) | ORAL | Status: DC | PRN

## 2023-02-02 MED ORDER — CALCIUM CARBONATE ANTACID 500 MG PO CHEW: 2.0000 | CHEWABLE_TABLET | ORAL | Status: DC | PRN

## 2023-02-02 NOTE — OB Triage Note (Signed)

## 2023-02-02 NOTE — OB Triage Note (Signed)
Pt Jamie Collins 22 y.o. presents to labor and delivery triage reporting spotting. 2 weeks ago she had spotting with pain. Friday, she passed a small clot. She noticed some bright red and brown blood since that time. The bleeding is described as spotting. Pt doesn't feel the need to wear a panty-liner. The cramping started to get worse today. She has had some intermittent cramping rating 6/10 at the worst. Pt is a G1P0000 at [redacted]w[redacted]d . Pt denies signs and symptons consistent with rupture of membranes or active vaginal bleeding. Pt denies contractions and states positive fetal movement. TOCO applied to non-tender abdomen and assessing. Doppler of FHR is 155. Vital signs obtained and within normal limits. Provider notified of pt.

## 2023-02-02 NOTE — Discharge Summary (Signed)
Milda Lindvall is a 22 y.o. female. She is at [redacted]w[redacted]d gestation. Patient's last menstrual period was 08/16/2022 (exact date). 05/23/2023, by Last Menstrual Period   Prenatal care site: [facility]  Chief complaint: spotting and cramping   HPI: Jesyka presents to L&D with complaints of vaginal spotting.  She reports intermittent spotting that started on Friday.  She's had 5 episodes of light pink spotting, mostly when she wipes.  Reports intermittent cramping that got worse today.  She was seen 2 weeks ago for similar symptoms, states cramping is not as severe as it was then.  Denies any recent intercourse.  Denies LOF.  States she's felt movement since coming to [facility] triage.    S: Resting comfortably. no CTX, no VB.no LOF,  Active fetal movement.   Maternal Medical History:  Past Medical Hx:  has a past medical history of Anxiety, Depression, Irregular menses, Migraine with aura, and UTI (urinary tract infection).    Past Surgical Hx:  has a past surgical history that includes Wisdom tooth extraction.   Allergies  Allergen Reactions   Other Other (See Comments)    CHERRIES. CHERRIES - GI Upset   Penicillins Hives     Prior to Admission medications   Medication Sig Start Date End Date Taking? Authorizing Provider  escitalopram (LEXAPRO) 10 MG tablet Take 10 mg by mouth daily.   Yes [provider]  ondansetron (ZOFRAN-ODT) 4 MG disintegrating tablet Take 4 mg by mouth every 8 (eight) hours as needed for nausea or vomiting.   Yes [provider]  nitrofurantoin, macrocrystal-monohydrate, (MACROBID) 100 MG capsule Take 1 capsule (100 mg total) by mouth 2 (two) times daily. 11/19/22   Linzie Collin, MD  Prenatal Vit-Fe Fumarate-FA (MULTIVITAMIN-PRENATAL) 27-0.8 MG TABS tablet Take 1 tablet by mouth daily at 12 noon.    [provider]  Pyridoxine HCl (B-6 PO) Take by mouth.    [provider]    Social History: She  reports that she has never  smoked. She has never used smokeless tobacco. She reports that she does not drink alcohol and does not use drugs.  Family History: family history includes Cancer (age of onset: 67) in her maternal great-grandmother; Diabetes in her paternal grandfather; Healthy in her brother, father, maternal grandfather, maternal grandmother, mother, paternal grandmother, sister, sister, sister, sister, and sister.   Review of Systems: A full review of systems was performed and negative except as noted in the HPI.     Pertinent Results:  Prenatal Labs: Blood type/Rh O positive   Antibody screen Negative    Rubella 4.15 (02/01 1500)   Varicella Immune  RPR Non Reactive (02/01 1500)   HBsAg Negative (02/01 1500)  Hep C NR   HIV Non Reactive (02/01 1500)   GC neg  Chlamydia neg  Genetic screening cfDNA negative/AFP neg    O:  BP 121/74 (BP Location: Left Arm)   Pulse 79   Temp 97.9 F (36.6 C) (Oral)   Resp 16   Ht 5\' 5"  (1.651 m)   Wt 64 kg   LMP 08/16/2022 (Exact Date)   BMI 23.48 kg/m  Results for orders placed or performed during the hospital encounter of 02/02/23 (from the past 48 hour(s))  Urinalysis, Complete w Microscopic -Urine, Clean Catch   Collection Time: 02/02/23 11:10 AM  Result Value Ref Range   Color, Urine YELLOW (A) YELLOW   APPearance CLEAR (A) CLEAR   Specific Gravity, Urine 1.006 1.005 - 1.030  pH 8.0 5.0 - 8.0   Glucose, UA NEGATIVE NEGATIVE mg/dL   Hgb urine dipstick NEGATIVE NEGATIVE   Bilirubin Urine NEGATIVE NEGATIVE   Ketones, ur NEGATIVE NEGATIVE mg/dL   Protein, ur NEGATIVE NEGATIVE mg/dL   Nitrite NEGATIVE NEGATIVE   Leukocytes,Ua SMALL (A) NEGATIVE   RBC / HPF 0-5 0 - 5 RBC/hpf   WBC, UA 11-20 0 - 5 WBC/hpf   Bacteria, UA MANY (A) NONE SEEN   Squamous Epithelial / HPF 0-5 0 - 5 /HPF   Mucus PRESENT   Wet prep, genital   Collection Time: 02/02/23 12:45 PM  Result Value Ref Range   Yeast Wet Prep HPF POC NONE SEEN NONE SEEN   Trich, Wet Prep NONE  SEEN NONE SEEN   Clue Cells Wet Prep HPF POC NONE SEEN NONE SEEN   WBC, Wet Prep HPF POC >=10 (A) <10   Sperm NONE SEEN      US OB Limited 02/02/2023  Narrative CLINICAL DATA:  Pregnant patient with bleeding  EXAM: LIMITED OBSTETRIC ULTRASOUND  COMPARISON:  None Available.  FINDINGS: Number of Fetuses: 1 Heart Rate:  141 bpm Movement: Yes Presentation: Breech Placental Location: Posterior Previa: No. The lower edge of the placenta is 4.8 cm superior to the internal cervical os. Amniotic Fluid (Subjective):  Within normal limits.  BPD: 5.37 cm 22 w  2 d  MATERNAL FINDINGS: Cervix:  Appears closed.  Approximately 2.85 cm Uterus/Adnexae: No abnormality visualized.  IMPRESSION: Single live IUP.  No cause for bleeding identified.  This exam is performed on an emergent basis and does not comprehensively evaluate fetal size, dating, or anatomy; follow-up complete OB US should be considered if further fetal assessment is warranted.  Electronically Signed By: Gerome Sam III M.D. On: 02/02/2023 12:17   Constitutional: NAD, AAOx3  HE/ENT: extraocular movements grossly intact, moist mucous membranes CV: RRR PULM: nl respiratory effort Abd: gravid, non-tender, non-distended, soft  Ext: Non-tender, Nonedmeatous Psych: mood appropriate, speech normal Pelvic : moderate amount of physiologic discharge  Cervix: Visually closed, long, no active bleeding    Fetal monitoring  Doppler: 150 bpm differentiated from maternal heart beat  Tocometry: occasional mild contractions   Assessment: 22 y.o. G1P0000 [redacted]w[redacted]d 05/23/2023, by Last Menstrual Period   Principle diagnosis: Vaginal bleeding in pregnancy, second trimester [O46.92]   Plan: 1) Fetal status  -FHR appropriate for gestational age  -Reassuring fetal status   2) Preterm labor - not present  -Cervical length reassuring -Wet prep negative for vaginal infections  -Urine culture pending  -No ongoing contractions or  vagina bleeding  -Discussed warning signs to return to L&D triage with  -Reviewed comfort measures   3) Disposition: discharge home stable -Precautions reviewed  -Follow up this week with prenatal provider   ----- Margaretmary Eddy, CNM Certified Nurse Midwife [facility]

## 2023-02-03 LAB — URINE CULTURE: Culture: >=100000 — AB

## 2023-02-04 LAB — URINE CULTURE

## 2023-03-27 ENCOUNTER — Encounter: Payer: Self-pay | Admitting: Obstetrics and Gynecology

## 2023-03-27 ENCOUNTER — Observation Stay
Admission: EM | Admit: 2023-03-27 | Discharge: 2023-03-27 | Disposition: A | Discharge status: Home / Self Care | Payer: Managed Care, Other (non HMO) | Attending: Obstetrics | Admitting: Obstetrics

## 2023-03-27 ENCOUNTER — Other Ambulatory Visit: Payer: Self-pay

## 2023-03-27 DIAGNOSIS — Z79899 Other long term (current) drug therapy: Secondary | ICD-10-CM | POA: Insufficient documentation

## 2023-03-27 DIAGNOSIS — Z3A31 31 weeks gestation of pregnancy: Secondary | ICD-10-CM | POA: Diagnosis not present

## 2023-03-27 DIAGNOSIS — R319 Hematuria, unspecified: Secondary | ICD-10-CM | POA: Insufficient documentation

## 2023-03-27 DIAGNOSIS — Z3480 Encounter for supervision of other normal pregnancy, unspecified trimester: Secondary | ICD-10-CM

## 2023-03-27 DIAGNOSIS — O26893 Other specified pregnancy related conditions, third trimester: Principal | ICD-10-CM | POA: Insufficient documentation

## 2023-03-27 DIAGNOSIS — O26853 Spotting complicating pregnancy, third trimester: Secondary | ICD-10-CM | POA: Insufficient documentation

## 2023-03-27 DIAGNOSIS — O4690 Antepartum hemorrhage, unspecified, unspecified trimester: Secondary | ICD-10-CM | POA: Diagnosis present

## 2023-03-27 LAB — URINALYSIS, COMPLETE (UACMP) WITH MICROSCOPIC
Bilirubin Urine: NEGATIVE
Glucose, UA: NEGATIVE mg/dL
Ketones, ur: NEGATIVE mg/dL
Nitrite: NEGATIVE
Protein, ur: NEGATIVE mg/dL
Specific Gravity, Urine: 1.014 (ref 1.005–1.030)
WBC, UA: >50 WBC/hpf (ref 0–5)
pH: 7 (ref 5.0–8.0)

## 2023-03-27 LAB — WET PREP, GENITAL
Clue Cells Wet Prep HPF POC: NONE SEEN
Sperm: NONE SEEN
Trich, Wet Prep: NONE SEEN
WBC, Wet Prep HPF POC: >=10 — AB (ref <10)
Yeast Wet Prep HPF POC: NONE SEEN

## 2023-03-27 LAB — CHLAMYDIA/NGC RT PCR (ARMC ONLY)
Chlamydia Tr: NOT DETECTED
N gonorrhoeae: NOT DETECTED

## 2023-03-27 MED ORDER — METRONIDAZOLE 500 MG PO TABS: 500.0000 mg | ORAL_TABLET | Freq: Two times a day (BID) | ORAL | 0 refills | Status: AC

## 2023-03-27 NOTE — OB Triage Note (Signed)
22 y.o G1P0 presents to L&D triage for blood in her urine. Pt denies ctx's, vaginal bleeding or LOF. Labs performed and resulted. Pt being discharged home by Wendall Papa for BV treatment. Discharge instructions reviewed and pt verbalized understanding.

## 2023-03-27 NOTE — Discharge Summary (Signed)
Jamie Collins is a 22 y.o. female. She is at [redacted]w[redacted]d gestation. Patient's last menstrual period was 08/16/2022 (exact date). Estimated Date of Delivery: 05/23/23  Prenatal care site: [facility]  Chief complaint: blood with urination  HPI: Jamie Collins presents to L&D with complaints of spotting with urination. UA is negative and there is a urine culture pending. She reports having recurrent uti's and believes the blood is coming from her urethra  Factors complicating pregnancy: Depression and anxiety  S: Resting comfortably. no CTX, no VB.no LOF,  Active fetal movement.   Maternal Medical History:  Past Medical Hx:  has a past medical history of Anxiety, Depression, Irregular menses, Migraine with aura, and UTI (urinary tract infection).    Past Surgical Hx:  has a past surgical history that includes Wisdom tooth extraction.   Allergies  Allergen Reactions   Other Other (See Comments)    CHERRIES. CHERRIES - GI Upset   Penicillins Hives     Prior to Admission medications   Medication Sig Start Date End Date Taking? Authorizing Provider  Prenatal Vit-Fe Fumarate-FA (MULTIVITAMIN-PRENATAL) 27-0.8 MG TABS tablet Take 1 tablet by mouth daily at 12 noon.   Yes [provider]  escitalopram (LEXAPRO) 10 MG tablet Take 10 mg by mouth daily. Patient not taking: Reported on 03/27/2023    [provider]  ondansetron (ZOFRAN-ODT) 4 MG disintegrating tablet Take 4 mg by mouth every 8 (eight) hours as needed for nausea or vomiting. Patient not taking: Reported on 03/27/2023    [provider]  Pyridoxine HCl (B-6 PO) Take by mouth. Patient not taking: Reported on 03/27/2023    [provider]    Social History: She  reports that she has never smoked. She has never used smokeless tobacco. She reports that she does not drink alcohol and does not use drugs.  Family History: family history includes Cancer (age of onset: 43) in her maternal  great-grandmother; Diabetes in her paternal grandfather; Healthy in her brother, father, maternal grandfather, maternal grandmother, mother, paternal grandmother, sister, sister, sister, sister, and sister. ,no history of gyn cancers  Review of Systems: A full review of systems was performed and negative except as noted in the HPI.    O:  Temp 98 F (36.7 C) (Oral)   Resp 16   LMP 08/16/2022 (Exact Date)  Results for orders placed or performed during the hospital encounter of 03/27/23 (from the past 48 hour(s))  Urinalysis, Complete w Microscopic -Urine, Clean Catch   Collection Time: 03/27/23  1:43 PM  Result Value Ref Range   Color, Urine YELLOW (A) YELLOW   APPearance HAZY (A) CLEAR   Specific Gravity, Urine 1.014 1.005 - 1.030   pH 7.0 5.0 - 8.0   Glucose, UA NEGATIVE NEGATIVE mg/dL   Hgb urine dipstick SMALL (A) NEGATIVE   Bilirubin Urine NEGATIVE NEGATIVE   Ketones, ur NEGATIVE NEGATIVE mg/dL   Protein, ur NEGATIVE NEGATIVE mg/dL   Nitrite NEGATIVE NEGATIVE   Leukocytes,Ua MODERATE (A) NEGATIVE   RBC / HPF 11-20 0 - 5 RBC/hpf   WBC, UA >50 0 - 5 WBC/hpf   Bacteria, UA FEW (A) NONE SEEN   Squamous Epithelial / HPF 6-10 0 - 5 /HPF   Mucus PRESENT   Wet prep, genital   Collection Time: 03/27/23  2:38 PM   Specimen: Cervical/Vaginal swab  Result Value Ref Range   Yeast Wet Prep HPF POC NONE SEEN NONE SEEN   Trich, Wet Prep NONE SEEN NONE SEEN  Clue Cells Wet Prep HPF POC NONE SEEN NONE SEEN   WBC, Wet Prep HPF POC >=10 (A) <10   Sperm NONE SEEN   Chlamydia/NGC rt PCR ([facility] only)   Collection Time: 03/27/23  2:38 PM   Specimen: Cervical/Vaginal swab  Result Value Ref Range   Specimen source GC/Chlam ENDOCERVICAL    Chlamydia Tr NOT DETECTED NOT DETECTED   N gonorrhoeae NOT DETECTED NOT DETECTED     Constitutional: NAD, AAOx3  HE/ENT: extraocular movements grossly intact, moist mucous membranes CV: RRR PULM: nl respiratory effort, CTABL Abd: gravid, non-tender,  non-distended, soft  Ext: Non-tender, Nonedmeatous Psych: mood appropriate, speech normal Pelvic : deferred SVE: Dilation: Closed Effacement (%): Thick Station: -3 Exam by:: Jamie Collins CNM   Fetal Monitor: Baseline: 145 bpm Variability: moderate Accels: Present Decels: variable Toco: none  Category: I   Assessment: 22 y.o. [redacted]w[redacted]d here for antenatal surveillance during pregnancy.  Principle diagnosis:  There were no encounter diagnoses.   Plan: Labor: not present.  Fetal Wellbeing: Reassuring Cat 1 tracing. Reactive NST  Wet prep neg, will treat for BV based on bedside assessment GC/Chlamydia neg UA negative D/c home stable, precautions reviewed, follow-up as scheduled.   ----- Jamie Collins, CNM Certified Nurse Midwife [place]  [facility]

## 2023-03-27 NOTE — OB Triage Note (Signed)
Presents with concern about noticing blood in commode and tissue when she went to bathroom. It started around 8am. She has noted it 3 times today, all 3 when using the bathroom to urinate. Pt. Noticed alittle dark spot of blood in panties upon arrival here. No other blood noted in panties prior to arrival here. Denies any pain with urination, increase in urgency or frequency. Urine specimen obtained upon arrival . EFMs applied.

## 2023-04-01 ENCOUNTER — Other Ambulatory Visit: Payer: Self-pay

## 2023-04-01 ENCOUNTER — Observation Stay
Admission: EM | Admit: 2023-04-01 | Discharge: 2023-04-01 | Disposition: A | Discharge status: Home / Self Care | Payer: Managed Care, Other (non HMO) | Attending: Obstetrics and Gynecology | Admitting: Obstetrics and Gynecology

## 2023-04-01 ENCOUNTER — Encounter: Payer: Self-pay | Admitting: Obstetrics and Gynecology

## 2023-04-01 DIAGNOSIS — O26893 Other specified pregnancy related conditions, third trimester: Secondary | ICD-10-CM | POA: Diagnosis present

## 2023-04-01 DIAGNOSIS — O26899 Other specified pregnancy related conditions, unspecified trimester: Secondary | ICD-10-CM | POA: Diagnosis present

## 2023-04-01 DIAGNOSIS — Z3A32 32 weeks gestation of pregnancy: Secondary | ICD-10-CM | POA: Insufficient documentation

## 2023-04-01 DIAGNOSIS — R1030 Lower abdominal pain, unspecified: Secondary | ICD-10-CM | POA: Diagnosis not present

## 2023-04-01 MED ORDER — ACETAMINOPHEN 500 MG PO TABS
1000.0000 mg | ORAL_TABLET | Freq: Four times a day (QID) | ORAL | Status: DC | PRN
  Administered 2023-04-01: 1000 mg via ORAL
  Filled 2023-04-01: qty 2

## 2023-04-01 MED ORDER — CALCIUM CARBONATE ANTACID 500 MG PO CHEW: 2.0000 | CHEWABLE_TABLET | ORAL | Status: DC | PRN

## 2023-04-01 NOTE — OB Triage Note (Signed)
Patient is a   22 yo, G1 P0, at 32 weeks 4 days. Patient presents with complaints of abdominal pain that began around 1500 rated a 8/10 at onset but currently a 3/10.  Patient denies any vaginal bleeding or LOF. Patient reports +FM. Monitors applied and assessing. VSS. Initial fetal heart tone 135. Tami Lin, CNM notified of patients arrival to unit. Plan to complete NST and monitor for CTX.

## 2023-04-01 NOTE — Discharge Summary (Signed)
Jamie Collins is a 22 y.o. female. She is at [redacted]w[redacted]d gestation. Patient's last menstrual period was 08/16/2022 (exact date). 05/23/2023, by Last Menstrual Period   Prenatal care site: [facility]  Chief complaint: lower abdominal cramping   HPI: Jamie Collins presents to L&D with complaints of lower abdominal cramping that started earlier today and got worse throughout the afternoon.  Denies vaginal bleeding or LOF. Endorses good fetal movement.  Started on Bactrim DS yesterday for UTI and will complete Flagyl for BV on Thursday.    S: Resting comfortably. no CTX, no VB.no LOF,  Active fetal movement.   Maternal Medical History:  Past Medical Hx:  has a past medical history of Anxiety, Depression, Irregular menses, Migraine with aura, and UTI (urinary tract infection).    Past Surgical Hx:  has a past surgical history that includes Wisdom tooth extraction.   Allergies  Allergen Reactions   Other Other (See Comments)    CHERRIES. CHERRIES - GI Upset   Penicillins Hives     Prior to Admission medications   Medication Sig Start Date End Date Taking? Authorizing Provider  metroNIDAZOLE (FLAGYL) 500 MG tablet Take 1 tablet (500 mg total) by mouth 2 (two) times daily for 7 days. 03/27/23 04/03/23  Sonny Dandy, CNM  Prenatal Vit-Fe Fumarate-FA (MULTIVITAMIN-PRENATAL) 27-0.8 MG TABS tablet Take 1 tablet by mouth daily at 12 noon.    [provider]    Social History: She  reports that she has never smoked. She has never used smokeless tobacco. She reports that she does not drink alcohol and does not use drugs.  Family History: family history includes Cancer (age of onset: 32) in her maternal great-grandmother; Diabetes in her paternal grandfather; Healthy in her brother, father, maternal grandfather, maternal grandmother, mother, paternal grandmother, sister, sister, sister, sister, and sister.   Review of Systems: A full review of systems was performed and  negative except as noted in the HPI.     Pertinent Results:  Prenatal Labs: Blood type/Rh O pos  Antibody screen Negative    Rubella 4.15 (02/01 1500)   Varicella Immune  RPR Non Reactive (02/01 1500)   HBsAg Negative (02/01 1500)  Hep C NR   HIV Non Reactive (02/01 1500)   GC neg  Chlamydia neg  Genetic screening cfDNA negative/AFP neg  1 hour GTT 83  3 hour GTT N/A  GBS Unknown       O:  BP 114/67   Pulse 95   Temp 97.7 F (36.5 C) (Oral)   LMP 08/16/2022 (Exact Date)  No results found for this or any previous visit (from the past 48 hour(s)).   Constitutional: NAD, AAOx3  HE/ENT: extraocular movements grossly intact, moist mucous membranes PULM: nl respiratory effort Abd: gravid, non-tender, non-distended, soft  Ext: Non-tender, Nonedmeatous Psych: mood appropriate, speech normal Pelvic : deferred   NST: Baseline FHR: 125 beats/min Variability: moderate Accelerations: present Decelerations: absent Tocometry: Occasional   Interpretation:  INDICATIONS: rule out uterine contractions RESULTS:  A NST procedure was performed with FHR monitoring and a normal baseline established, appropriate time of 20-40 minutes of evaluation, and accels >2 seen w 15x15 characteristics.  Results show a REACTIVE NST.   Assessment: 22 y.o. G1P0000 [redacted]w[redacted]d 05/23/2023, by Last Menstrual Period   Principle diagnosis: Cramping affecting pregnancy, antepartum [O26.899, R10.9]   Plan: 1) Reactive NST  -Category 1 tracing  -Reassuring fetal status   2) Labor: Not present  -Cramping improved with Tylenol, rest, and hydration  -  Discussed warning signs to return to L&D triage with  -Reviewed comfort measures  -Continue with antibiotics for UTI   3) Disposition: discharge home stable -Precautions reviewed  -Follow up in 1 week    ----- Margaretmary Eddy, CNM Certified Nurse Midwife [place]  [facility]

## 2023-05-23 ENCOUNTER — Other Ambulatory Visit: Payer: Self-pay | Admitting: Obstetrics

## 2023-05-23 DIAGNOSIS — Z3490 Encounter for supervision of normal pregnancy, unspecified, unspecified trimester: Secondary | ICD-10-CM

## 2023-05-24 ENCOUNTER — Inpatient Hospital Stay: Payer: Managed Care, Other (non HMO) | Admitting: Anesthesiology

## 2023-05-24 ENCOUNTER — Other Ambulatory Visit: Payer: Self-pay

## 2023-05-24 ENCOUNTER — Inpatient Hospital Stay
Admission: EM | Admit: 2023-05-24 | Discharge: 2023-05-26 | DRG: 806 | Disposition: A | Discharge status: Home / Self Care | Payer: Managed Care, Other (non HMO) | Attending: Obstetrics | Admitting: Obstetrics

## 2023-05-24 ENCOUNTER — Encounter: Payer: Self-pay | Admitting: Obstetrics and Gynecology

## 2023-05-24 DIAGNOSIS — Z3A40 40 weeks gestation of pregnancy: Secondary | ICD-10-CM

## 2023-05-24 DIAGNOSIS — O9081 Anemia of the puerperium: Secondary | ICD-10-CM | POA: Diagnosis not present

## 2023-05-24 DIAGNOSIS — Z880 Allergy status to penicillin: Secondary | ICD-10-CM

## 2023-05-24 DIAGNOSIS — Z3490 Encounter for supervision of normal pregnancy, unspecified, unspecified trimester: Secondary | ICD-10-CM | POA: Diagnosis present

## 2023-05-24 DIAGNOSIS — D62 Acute posthemorrhagic anemia: Secondary | ICD-10-CM | POA: Diagnosis not present

## 2023-05-24 DIAGNOSIS — O480 Post-term pregnancy: Secondary | ICD-10-CM | POA: Diagnosis present

## 2023-05-24 LAB — TYPE AND SCREEN
ABO/RH(D): O POS
Antibody Screen: NEGATIVE

## 2023-05-24 LAB — CBC
HCT: 37.7 % (ref 36.0–46.0)
Hemoglobin: 13 g/dL (ref 12.0–15.0)
MCH: 31.6 pg (ref 26.0–34.0)
MCHC: 34.5 g/dL (ref 30.0–36.0)
MCV: 91.7 fL (ref 80.0–100.0)
Platelets: 285 10*3/uL (ref 150–400)
RBC: 4.11 MIL/uL (ref 3.87–5.11)
RDW: 15.2 % (ref 11.5–15.5)
WBC: 11.8 10*3/uL — ABNORMAL HIGH (ref 4.0–10.5)
nRBC: 0 % (ref 0.0–0.2)

## 2023-05-24 LAB — ABO/RH: ABO/RH(D): O POS

## 2023-05-24 LAB — RPR: RPR Ser Ql: NONREACTIVE

## 2023-05-24 MED ORDER — PHENYLEPHRINE 80 MCG/ML (10ML) SYRINGE FOR IV PUSH (FOR BLOOD PRESSURE SUPPORT): 80.0000 ug | PREFILLED_SYRINGE | INTRAVENOUS | Status: DC | PRN

## 2023-05-24 MED ORDER — OXYTOCIN-SODIUM CHLORIDE 30-0.9 UT/500ML-% IV SOLN
1.0000 m[IU]/min | INTRAVENOUS | Status: DC
  Administered 2023-05-24 (×2): 2 m[IU]/min via INTRAVENOUS
  Filled 2023-05-24: qty 500

## 2023-05-24 MED ORDER — LIDOCAINE HCL (PF) 1 % IJ SOLN
INTRAMUSCULAR | Status: DC | PRN
  Administered 2023-05-24: 1 mL via SUBCUTANEOUS

## 2023-05-24 MED ORDER — METHYLERGONOVINE MALEATE 0.2 MG/ML IJ SOLN
INTRAMUSCULAR | Status: AC
  Filled 2023-05-24: qty 1

## 2023-05-24 MED ORDER — OXYTOCIN BOLUS FROM INFUSION: 333.0000 mL | Freq: Once | INTRAVENOUS | Status: DC

## 2023-05-24 MED ORDER — OXYTOCIN 10 UNIT/ML IJ SOLN
INTRAMUSCULAR | Status: AC
  Filled 2023-05-24: qty 2

## 2023-05-24 MED ORDER — ONDANSETRON HCL 4 MG/2ML IJ SOLN
4.0000 mg | Freq: Four times a day (QID) | INTRAMUSCULAR | Status: DC | PRN
  Administered 2023-05-24: 4 mg via INTRAVENOUS
  Filled 2023-05-24: qty 2

## 2023-05-24 MED ORDER — OXYCODONE-ACETAMINOPHEN 5-325 MG PO TABS: 1.0000 | ORAL_TABLET | ORAL | Status: DC | PRN

## 2023-05-24 MED ORDER — TRANEXAMIC ACID-NACL 1000-0.7 MG/100ML-% IV SOLN
INTRAVENOUS | Status: AC
  Filled 2023-05-24: qty 100

## 2023-05-24 MED ORDER — LIDOCAINE HCL (PF) 1 % IJ SOLN
INTRAMUSCULAR | Status: AC
  Filled 2023-05-24: qty 30

## 2023-05-24 MED ORDER — SOD CITRATE-CITRIC ACID 500-334 MG/5ML PO SOLN: 30.0000 mL | ORAL | Status: DC | PRN

## 2023-05-24 MED ORDER — MISOPROSTOL 25 MCG QUARTER TABLET
25.0000 ug | ORAL_TABLET | ORAL | Status: DC
  Administered 2023-05-24 (×2): 25 ug via VAGINAL
  Filled 2023-05-24 (×2): qty 1

## 2023-05-24 MED ORDER — LIDOCAINE-EPINEPHRINE (PF) 1.5 %-1:200000 IJ SOLN
INTRAMUSCULAR | Status: DC | PRN
  Administered 2023-05-24: 3 mL via EPIDURAL

## 2023-05-24 MED ORDER — AMMONIA AROMATIC IN INHA
RESPIRATORY_TRACT | Status: AC
  Filled 2023-05-24: qty 10

## 2023-05-24 MED ORDER — TERBUTALINE SULFATE 1 MG/ML IJ SOLN
INTRAMUSCULAR | Status: AC
  Filled 2023-05-24: qty 1

## 2023-05-24 MED ORDER — FENTANYL CITRATE (PF) 100 MCG/2ML IJ SOLN
50.0000 ug | INTRAMUSCULAR | Status: DC | PRN
  Administered 2023-05-24: 50 ug via INTRAVENOUS
  Filled 2023-05-24: qty 2

## 2023-05-24 MED ORDER — LACTATED RINGERS IV SOLN: 500.0000 mL | INTRAVENOUS | Status: DC | PRN

## 2023-05-24 MED ORDER — ACETAMINOPHEN 325 MG PO TABS
650.0000 mg | ORAL_TABLET | ORAL | Status: DC | PRN
  Administered 2023-05-24: 650 mg via ORAL
  Filled 2023-05-24: qty 2

## 2023-05-24 MED ORDER — OXYCODONE-ACETAMINOPHEN 5-325 MG PO TABS: 2.0000 | ORAL_TABLET | ORAL | Status: DC | PRN

## 2023-05-24 MED ORDER — LACTATED RINGERS IV SOLN
500.0000 mL | Freq: Once | INTRAVENOUS | Status: AC
  Administered 2023-05-24: 500 mL via INTRAVENOUS

## 2023-05-24 MED ORDER — MISOPROSTOL 25 MCG QUARTER TABLET
25.0000 ug | ORAL_TABLET | ORAL | Status: DC
  Administered 2023-05-24 (×2): 25 ug via ORAL
  Filled 2023-05-24 (×3): qty 1

## 2023-05-24 MED ORDER — EPHEDRINE 5 MG/ML INJ: 10.0000 mg | INTRAVENOUS | Status: DC | PRN

## 2023-05-24 MED ORDER — FENTANYL-BUPIVACAINE-NACL 0.5-0.125-0.9 MG/250ML-% EP SOLN
12.0000 mL/h | EPIDURAL | Status: DC | PRN
  Administered 2023-05-24: 12 mL/h via EPIDURAL

## 2023-05-24 MED ORDER — FENTANYL-BUPIVACAINE-NACL 0.5-0.125-0.9 MG/250ML-% EP SOLN
EPIDURAL | Status: AC
  Filled 2023-05-24: qty 250

## 2023-05-24 MED ORDER — CALCIUM CARBONATE ANTACID 500 MG PO CHEW: 2.0000 | CHEWABLE_TABLET | Freq: Three times a day (TID) | ORAL | Status: DC | PRN

## 2023-05-24 MED ORDER — OXYTOCIN-SODIUM CHLORIDE 30-0.9 UT/500ML-% IV SOLN
2.5000 [IU]/h | INTRAVENOUS | Status: DC
  Filled 2023-05-24: qty 500

## 2023-05-24 MED ORDER — MISOPROSTOL 200 MCG PO TABS
ORAL_TABLET | ORAL | Status: AC
  Filled 2023-05-24: qty 4

## 2023-05-24 MED ORDER — LACTATED RINGERS IV SOLN: INTRAVENOUS | Status: DC

## 2023-05-24 MED ORDER — DIPHENHYDRAMINE HCL 50 MG/ML IJ SOLN: 12.5000 mg | INTRAMUSCULAR | Status: DC | PRN

## 2023-05-24 MED ORDER — CARBOPROST TROMETHAMINE 250 MCG/ML IM SOLN
INTRAMUSCULAR | Status: AC
  Filled 2023-05-24: qty 1

## 2023-05-24 MED ORDER — TERBUTALINE SULFATE 1 MG/ML IJ SOLN
0.2500 mg | Freq: Once | INTRAMUSCULAR | Status: AC | PRN
  Administered 2023-05-24: 0.25 mg via SUBCUTANEOUS
  Filled 2023-05-24: qty 1

## 2023-05-24 MED ORDER — LIDOCAINE HCL (PF) 1 % IJ SOLN: 30.0000 mL | INTRAMUSCULAR | Status: DC | PRN

## 2023-05-24 NOTE — Progress Notes (Signed)
Patient ID: Jamie Collins, female   DOB: June 21, 2001, 21 y.o.   MRN: [id number] Notified about fetal bradycardia for 5 min after CLE placement not associated with hypotension . CNM Dickerson in room managing . Pitocin off . Return of fetal heart now with accels . CTX tachysystole5-6/10 min. Instructed to given terbutaline to slow ctx  for now

## 2023-05-24 NOTE — H&P (Signed)
OB History & Physical   History of Present Illness:   Chief Complaint: IOL for postdates  HPI:  Jamie Collins is a 22 y.o. G1P0000 female at [redacted]w[redacted]d, Patient's last menstrual period was 08/16/2022 (exact date)., consistent with [place] at [redacted]w[redacted]d, with Estimated Date of Delivery: 05/23/23.  She presents to L&D for IOL for postdates  Reports active fetal movement  Contractions: every 2 to 3 minutes LOF/SROM: intact Vaginal bleeding: none  Factors complicating pregnancy:  Anxiety and depression  Patient Active Problem List   Diagnosis Date Noted   Encounter for induction of labor 05/24/2023   Cramping affecting pregnancy, antepartum 04/01/2023   Vaginal bleeding during pregnancy, antepartum 03/27/2023   Vaginal bleeding in pregnancy, second trimester 02/02/2023   Encounter for supervision of normal pregnancy in third trimester 12/04/2022   Supervision of other normal pregnancy, antepartum 10/11/2022   Possible pregnancy, not confirmed 10/03/2022    Prenatal Transfer Tool  Maternal Diabetes: No Genetic Screening: Normal Maternal Ultrasounds/Referrals: Normal Fetal Ultrasounds or other Referrals:  None Maternal Substance Abuse:  No Significant Maternal Medications:  None Significant Maternal Lab Results: Group B Strep negative  Maternal Medical History:   Past Medical History:  Diagnosis Date   Anxiety    Depression    Irregular menses    Migraine with aura    UTI (urinary tract infection)     Past Surgical History:  Procedure Laterality Date   WISDOM TOOTH EXTRACTION      Allergies  Allergen Reactions   Other Other (See Comments)    CHERRIES. CHERRIES - GI Upset   Penicillins Hives    Prior to Admission medications   Medication Sig Start Date End Date Taking? Authorizing Provider  Prenatal Vit-Fe Fumarate-FA (MULTIVITAMIN-PRENATAL) 27-0.8 MG TABS tablet Take 1 tablet by mouth daily at 12 noon.   Yes [provider]     Prenatal care site:  [facility]  OB History  Gravida Para Term Preterm AB Living  1 0 0 0 0 0  SAB IAB Ectopic Multiple Live Births  0 0 0 0 0    # Outcome Date GA Lbr Len/2nd Weight Sex Type Anes PTL Lv  1 Current              Social History: She  reports that she has never smoked. She has never used smokeless tobacco. She reports that she does not drink alcohol and does not use drugs.  Family History: family history includes Cancer (age of onset: 84) in her maternal great-grandmother; Diabetes in her paternal grandfather; Healthy in her brother, father, maternal grandfather, maternal grandmother, mother, paternal grandmother, sister, sister, sister, sister, and sister.   Review of Systems: A full review of systems was performed and negative except as noted in the HPI.     Physical Exam:  Vital Signs: BP 125/69 (BP Location: Right Arm)   Pulse 91   Temp 98.1 F (36.7 C) (Oral)   Resp 16   Ht 5\' 5"  (1.651 m)   Wt 81.2 kg   LMP 08/16/2022 (Exact Date)   BMI 29.79 kg/m   General: no acute distress.  HEENT: normocephalic, atraumatic Heart: regular rate & rhythm Lungs: normal respiratory effort Abdomen: soft, gravid, non-tender;  Pelvic:   External: Normal external female genitalia  Cervix: Dilation: 1.5 / Effacement (%): 50 / Station: -3    Extremities: non-tender, symmetric, no edema bilaterally.  DTRs: +2  Neurologic: Alert & oriented x 3.    Results for orders placed  or performed during the hospital encounter of 05/24/23 (from the past 24 hour(s))  CBC     Status: Abnormal   Collection Time: 05/24/23  1:31 AM  Result Value Ref Range   WBC 11.8 (H) 4.0 - 10.5 K/uL   RBC 4.11 3.87 - 5.11 MIL/uL   Hemoglobin 13.0 12.0 - 15.0 g/dL   HCT 53.6 64.4 - 03.4 %   MCV 91.7 80.0 - 100.0 fL   MCH 31.6 26.0 - 34.0 pg   MCHC 34.5 30.0 - 36.0 g/dL   RDW 74.2 59.5 - 63.8 %   Platelets 285 150 - 400 K/uL   nRBC 0.0 0.0 - 0.2 %  Type and screen     Status: None   Collection Time: 05/24/23  1:31  AM  Result Value Ref Range   ABO/RH(D) O POS    Antibody Screen NEG    Sample Expiration      05/27/2023,2359 Performed at [facility], [address] [place], [place] [address]   RPR     Status: None   Collection Time: 05/24/23  1:31 AM  Result Value Ref Range   RPR Ser Ql NON REACTIVE NON REACTIVE  ABO/Rh     Status: None   Collection Time: 05/24/23  2:40 AM  Result Value Ref Range   ABO/RH(D)      O POS Performed at [facility] [address] [place], [place] [address]     Pertinent Results:  Prenatal Labs: Blood type/Rh O POS Performed at [facility], [address] [place], [place] [address]    Antibody screen Negative    Rubella 4.15 (02/01 1500)   Varicella Immune  RPR NON REACTIVE (08/17 0131)   HBsAg Negative (02/01 1500)  Hep C NR   HIV Non Reactive (02/01 1500)   GC neg  Chlamydia neg  Genetic screening cfDNA negative   1 hour GTT 83  3 hour GTT N/a  GBS neg     FHT:  FHR: 125 bpm, variability: moderate,  accelerations:  Present,  decelerations:  Absent Category/reactivity:  Category I UC:   irregular, every 2-3 minutes   Cephalic by Leop[place] and SVE   No results found.  Assessment:  Jamie Collins is a 22 y.o. G1P0000 female at [redacted]w[redacted]d with elective IOL.   Plan:  1. Admit to Labor & Delivery - consents reviewed and obtained - Dr. Feliberto Gottron notified of admission and plan of care   2. Fetal Well being  - Fetal Tracing: category 1 - Group B Streptococcus ppx not indicated: GBS negative - Presentation: cephalic confirmed by sve   3. Routine OB: - Prenatal labs reviewed, as above - Rh positive - CBC, T&S, RPR on admit - Clear liquid diet , continuous IV fluids  4. Induction of labor  - Contractions monitored with external toco - Pelvis adequate for trial of labor  - Plan for induction with misoprostol and cervical balloon  - Augmentation with oxytocin and AROM as appropriate  - Plan for  continuous  fetal monitoring - Maternal pain control as desired; planning regional anesthesia and IVPM - Anticipate vaginal delivery  5. Post Partum Planning: - Infant feeding: formula feeding - Contraception: IUD Mirena - Tdap vaccine: Given prenatally - Flu vaccine: Given prenatally  Jerrianne Hartin Wonda Amis, CNM 05/24/23 12:31 PM  Chari Manning, CNM Certified Nurse Midwife [place]  [facility]

## 2023-05-24 NOTE — Progress Notes (Signed)
L&D Note    Subjective:  has no unusual complaints  Objective:   Vitals:   05/24/23 1300 05/24/23 1420 05/24/23 1425 05/24/23 1501  BP: 133/86  129/78 111/63  Pulse: 82  89 85  Resp:      Temp: 98.1 F (36.7 C)     TempSrc: Oral     SpO2:  99% 97% 96%  Weight:      Height:        Current Vital Signs 24h Vital Sign Ranges  T 98.1 F (36.7 C) Temp  Avg: 98.1 F (36.7 C)  Min: 98.1 F (36.7 C)  Max: 98.2 F (36.8 C)  BP 111/63 BP  Min: 111/63  Max: 133/86  HR 85 Pulse  Avg: 93.2  Min: 82  Max: 106  RR 16 Resp  Avg: 17  Min: 16  Max: 18  SaO2 96 %   SpO2  Avg: 97.3 %  Min: 96 %  Max: 99 %      Gen: alert, cooperative, no distress FHR: Baseline: 118 bpm, Variability: moderate, Accels: Abscent, Decels: prolonged decel x 2 for 5 mins Toco: regular, every 2 minutes SVE: Dilation: 6.5 Effacement (%): 50 Cervical Position: Middle Station: -2 Presentation: Vertex Exam by:: Marzetta Merino, RN  Medications SCHEDULED MEDICATIONS   misoprostol  25 mcg Oral Q4H   And   misoprostol  25 mcg Vaginal Q4H   oxytocin 40 units in LR 1000 mL  333 mL Intravenous Once    MEDICATION INFUSIONS   fentaNYL 2 mcg/mL w/bupivacaine 0.125% in NS 250 mL 12 mL/hr (05/24/23 1425)   lactated ringers     lactated ringers 125 mL/hr at 05/24/23 1003   oxytocin     oxytocin 2 milli-units/min (05/24/23 1255)    PRN MEDICATIONS  acetaminophen, diphenhydrAMINE, ePHEDrine, ePHEDrine, fentaNYL (SUBLIMAZE) injection, fentaNYL 2 mcg/mL w/bupivacaine 0.125% in NS 250 mL, lactated ringers, lidocaine (PF), ondansetron, oxyCODONE-acetaminophen, oxyCODONE-acetaminophen, phenylephrine, phenylephrine, sodium citrate-citric acid, terbutaline   Assessment & Plan:  22 y.o. G1P0000 at [redacted]w[redacted]d admitted for elective IOL -Labor: Active phase labor., Satisfactory labor progress.Cervical balloon out after 1.5 hours, Dysfunctional uterine contractions., and Fetal intolerance of labor. -Fetal Well-being: Category II -GBS:  negative -Membranes ruptured, clear fluid -Anticipate vaginal delivery. and Intervention: IV fluid bolus, change maternal position, reduce stimulation (IV Pitocin), administer tocolytic, place IUPC, and FSE -Analgesia: regional anesthesia  Dr Schermorhorn aware of labor events and has reviewed FHT   Jamie Collins, CNM  05/24/2023 3:27 PM  [facility]

## 2023-05-24 NOTE — Anesthesia Procedure Notes (Signed)
Epidural Patient location during procedure: OB Start time: 05/24/2023 2:15 PM End time: 05/24/2023 2:26 PM  Staffing Anesthesiologist: Foye Deer, MD Performed: anesthesiologist   Preanesthetic Checklist Completed: patient identified, IV checked, site marked, risks and benefits discussed, surgical consent, monitors and equipment checked, pre-op evaluation and timeout performed  Epidural Patient position: sitting Prep: ChloraPrep Patient monitoring: heart rate, continuous pulse ox and blood pressure Approach: midline Location: L3-L4 Injection technique: LOR saline  Needle:  Needle type: Tuohy  Needle gauge: 18 G Needle length: 9 cm Needle insertion depth: 7 cm Catheter type: closed end Catheter size: 20 Guage Catheter at skin depth: 12 cm Test dose: negative and 1.5% lidocaine with Epi 1:200 K  Assessment Events: blood not aspirated, no cerebrospinal fluid, injection not painful, no injection resistance and no paresthesia  Additional Notes Reason for block:procedure for pain

## 2023-05-24 NOTE — Anesthesia Preprocedure Evaluation (Signed)
Anesthesia Evaluation  Patient identified by MRN, date of birth, ID band Patient awake    Reviewed: Allergy & Precautions, H&P , NPO status , Patient's Chart, lab work & pertinent test results  Airway Mallampati: II  TM Distance: >3 FB Neck ROM: full    Dental no notable dental hx.    Pulmonary neg pulmonary ROS   Pulmonary exam normal        Cardiovascular Exercise Tolerance: Good negative cardio ROS Normal cardiovascular exam     Neuro/Psych    GI/Hepatic negative GI ROS,,,  Endo/Other    Renal/GU   negative genitourinary   Musculoskeletal   Abdominal   Peds  Hematology negative hematology ROS (+)   Anesthesia Other Findings Past Medical History: No date: Anxiety No date: Depression No date: Irregular menses No date: Migraine with aura No date: UTI (urinary tract infection)  Past Surgical History: No date: WISDOM TOOTH EXTRACTION  BMI    Body Mass Index: 29.79 kg/m      Reproductive/Obstetrics (+) Pregnancy                             Anesthesia Physical Anesthesia Plan  ASA: 2  Anesthesia Plan: Epidural   Post-op Pain Management:    Induction:   PONV Risk Score and Plan:   Airway Management Planned:   Additional Equipment:   Intra-op Plan:   Post-operative Plan:   Informed Consent: I have reviewed the patients History and Physical, chart, labs and discussed the procedure including the risks, benefits and alternatives for the proposed anesthesia with the patient or authorized representative who has indicated his/her understanding and acceptance.       Plan Discussed with: Anesthesiologist and CRNA  Anesthesia Plan Comments:        Anesthesia Quick Evaluation

## 2023-05-24 NOTE — Progress Notes (Signed)
L&D Note    Subjective:  has no unusual complaints  Objective:   Vitals:   05/24/23 1515 05/24/23 1520 05/24/23 1525 05/24/23 1530  BP:  124/70  129/68  Pulse:  (!) 102  93  Resp:      Temp:      TempSrc:      SpO2: 96% 97% 94% 96%  Weight:      Height:        Current Vital Signs 24h Vital Sign Ranges  T 98.1 F (36.7 C) Temp  Avg: 98.1 F (36.7 C)  Min: 98.1 F (36.7 C)  Max: 98.2 F (36.8 C)  BP 129/68 BP  Min: 111/63  Max: 133/86  HR 93 Pulse  Avg: 95.2  Min: 80  Max: 118  RR 16 Resp  Avg: 17  Min: 16  Max: 18  SaO2 96 %   SpO2  Avg: 96.6 %  Min: 94 %  Max: 99 %      Gen: alert, cooperative, no distress FHR: Baseline: 135 bpm, Variability: moderate, Accels: Present, Decels: early Toco: regular, every 2 minutes SVE: Dilation: 7 Effacement (%): 50 Cervical Position: Middle Station: -2 Presentation: Vertex Exam by:: Exie Parody RN  Medications SCHEDULED MEDICATIONS   misoprostol  25 mcg Oral Q4H   And   misoprostol  25 mcg Vaginal Q4H   oxytocin 40 units in LR 1000 mL  333 mL Intravenous Once    MEDICATION INFUSIONS   fentaNYL 2 mcg/mL w/bupivacaine 0.125% in NS 250 mL 12 mL/hr (05/24/23 1425)   lactated ringers     lactated ringers 125 mL/hr at 05/24/23 1003   oxytocin     oxytocin 4 milli-units/min (05/24/23 1924)    PRN MEDICATIONS  acetaminophen, diphenhydrAMINE, ePHEDrine, ePHEDrine, fentaNYL (SUBLIMAZE) injection, fentaNYL 2 mcg/mL w/bupivacaine 0.125% in NS 250 mL, lactated ringers, lidocaine (PF), ondansetron, oxyCODONE-acetaminophen, oxyCODONE-acetaminophen, phenylephrine, phenylephrine, sodium citrate-citric acid   Assessment & Plan:  22 y.o. G1P0000 at [redacted]w[redacted]d admitted for elective IOL -Labor: Active phase labor. and Adequate uterine activity - intensity and frequency. -Fetal Well-being: Category I -GBS: negative -Membranes ruptured, clear fluid -Continue present management. and Anticipate vaginal delivery. -Analgesia: regional  anesthesia   Johnie Makki Wonda Amis, CNM  05/24/2023 7:59 PM  [facility]

## 2023-05-24 NOTE — Plan of Care (Signed)
  Problem: Education: Goal: Knowledge of General Education information will improve Description: Including pain rating scale, medication(s)/side effects and non-pharmacologic comfort measures Outcome: Progressing   Problem: Pain Managment: Goal: General experience of comfort will improve Outcome: Progressing   Problem: Safety: Goal: Ability to remain free from injury will improve Outcome: Progressing   Problem: Skin Integrity: Goal: Risk for impaired skin integrity will decrease Outcome: Progressing   Problem: Education: Goal: Knowledge of Childbirth will improve Outcome: Progressing Goal: Ability to make informed decisions regarding treatment and plan of care will improve Outcome: Progressing   Problem: Coping: Goal: Ability to verbalize concerns and feelings about labor and delivery will improve Outcome: Progressing

## 2023-05-25 ENCOUNTER — Encounter: Payer: Self-pay | Admitting: Obstetrics and Gynecology

## 2023-05-25 LAB — CBC
HCT: 32.1 % — ABNORMAL LOW (ref 36.0–46.0)
Hemoglobin: 11.3 g/dL — ABNORMAL LOW (ref 12.0–15.0)
MCH: 32.1 pg (ref 26.0–34.0)
MCHC: 35.2 g/dL (ref 30.0–36.0)
MCV: 91.2 fL (ref 80.0–100.0)
Platelets: 209 10*3/uL (ref 150–400)
RBC: 3.52 MIL/uL — ABNORMAL LOW (ref 3.87–5.11)
RDW: 15.7 % — ABNORMAL HIGH (ref 11.5–15.5)
WBC: 17.6 10*3/uL — ABNORMAL HIGH (ref 4.0–10.5)
nRBC: 0 % (ref 0.0–0.2)

## 2023-05-25 MED ORDER — SIMETHICONE 80 MG PO CHEW: 80.0000 mg | CHEWABLE_TABLET | ORAL | Status: DC | PRN

## 2023-05-25 MED ORDER — DIBUCAINE (PERIANAL) 1 % EX OINT: 1.0000 | TOPICAL_OINTMENT | CUTANEOUS | Status: DC | PRN

## 2023-05-25 MED ORDER — IBUPROFEN 600 MG PO TABS
600.0000 mg | ORAL_TABLET | Freq: Four times a day (QID) | ORAL | Status: DC
  Administered 2023-05-25 – 2023-05-26 (×6): 600 mg via ORAL
  Filled 2023-05-25 (×6): qty 1

## 2023-05-25 MED ORDER — ZOLPIDEM TARTRATE 5 MG PO TABS: 5.0000 mg | ORAL_TABLET | Freq: Every evening | ORAL | Status: DC | PRN

## 2023-05-25 MED ORDER — SENNOSIDES-DOCUSATE SODIUM 8.6-50 MG PO TABS: 2.0000 | ORAL_TABLET | ORAL | Status: DC

## 2023-05-25 MED ORDER — BENZOCAINE-MENTHOL 20-0.5 % EX AERO
1.0000 | INHALATION_SPRAY | CUTANEOUS | Status: DC | PRN
  Administered 2023-05-25 – 2023-05-26 (×2): 1 via TOPICAL
  Filled 2023-05-25 (×3): qty 56

## 2023-05-25 MED ORDER — WITCH HAZEL-GLYCERIN EX PADS
1.0000 | MEDICATED_PAD | CUTANEOUS | Status: DC | PRN
  Administered 2023-05-25 – 2023-05-26 (×2): 1 via TOPICAL
  Filled 2023-05-25 (×2): qty 100

## 2023-05-25 MED ORDER — ACETAMINOPHEN 325 MG PO TABS
650.0000 mg | ORAL_TABLET | ORAL | Status: DC | PRN
  Administered 2023-05-25: 650 mg via ORAL
  Filled 2023-05-25: qty 2

## 2023-05-25 MED ORDER — DIPHENHYDRAMINE HCL 25 MG PO CAPS: 25.0000 mg | ORAL_CAPSULE | Freq: Four times a day (QID) | ORAL | Status: DC | PRN

## 2023-05-25 MED ORDER — COCONUT OIL OIL
1.0000 | TOPICAL_OIL | Status: DC | PRN
  Administered 2023-05-25: 1 via TOPICAL
  Filled 2023-05-25: qty 30

## 2023-05-25 MED ORDER — PRENATAL MULTIVITAMIN CH
1.0000 | ORAL_TABLET | Freq: Every day | ORAL | Status: DC
  Administered 2023-05-25 – 2023-05-26 (×2): 1 via ORAL
  Filled 2023-05-25 (×2): qty 1

## 2023-05-25 MED ORDER — SENNOSIDES-DOCUSATE SODIUM 8.6-50 MG PO TABS
2.0000 | ORAL_TABLET | ORAL | Status: DC
  Administered 2023-05-25 – 2023-05-26 (×2): 2 via ORAL
  Filled 2023-05-25 (×2): qty 2

## 2023-05-25 MED ORDER — ONDANSETRON HCL 4 MG PO TABS: 4.0000 mg | ORAL_TABLET | ORAL | Status: DC | PRN

## 2023-05-25 MED ORDER — FLEET ENEMA RE ENEM: 1.0000 | ENEMA | Freq: Every day | RECTAL | Status: DC | PRN

## 2023-05-25 MED ORDER — BISACODYL 10 MG RE SUPP: 10.0000 mg | Freq: Every day | RECTAL | Status: DC | PRN

## 2023-05-25 MED ORDER — OXYCODONE HCL 5 MG PO TABS
5.0000 mg | ORAL_TABLET | ORAL | Status: DC | PRN
  Administered 2023-05-25 – 2023-05-26 (×2): 5 mg via ORAL
  Filled 2023-05-25 (×2): qty 1

## 2023-05-25 MED ORDER — SODIUM CHLORIDE 0.9% FLUSH: 3.0000 mL | Freq: Two times a day (BID) | INTRAVENOUS | Status: DC

## 2023-05-25 MED ORDER — SODIUM CHLORIDE 0.9% FLUSH: 3.0000 mL | INTRAVENOUS | Status: DC | PRN

## 2023-05-25 MED ORDER — ONDANSETRON HCL 4 MG/2ML IJ SOLN: 4.0000 mg | INTRAMUSCULAR | Status: DC | PRN

## 2023-05-25 MED ORDER — SODIUM CHLORIDE 0.9 % IV SOLN: 250.0000 mL | INTRAVENOUS | Status: DC | PRN

## 2023-05-25 NOTE — Discharge Summary (Signed)
Obstetrical Discharge Summary  Patient Name: Jamie Collins DOB: [birth year] MRN: [id number]  Date of admission: 05/24/2023 Delivery date:05/24/2023 Delivering provider: Sonny Dandy Date of discharge: 05/26/2023  Primary OB: [facility] NGE:[id number] last menstrual period was 08/16/2022 (exact date). EDC Estimated Date of Delivery: 05/23/23 Gestational Age at Delivery: [redacted]w[redacted]d  Antepartum complications:  Anxiety and depression  Prenatal Labs:   Blood type/Rh O POS Performed at [facility], [address] [place], [place] [address]    Antibody screen Negative    Rubella 4.15 (02/01 1500)   Varicella Immune  RPR NON REACTIVE (08/17 0131)   HBsAg Negative (02/01 1500)  Hep C NR   HIV Non Reactive (02/01 1500)   GC neg  Chlamydia neg  Genetic screening cfDNA negative   1 hour GTT 83  3 hour GTT N/a  GBS neg       Admitting diagnosis: Encounter for induction of labor [Z34.90] Intrauterine pregnancy: [redacted]w[redacted]d     Secondary diagnosis:  Principal Problem:   NSVD (normal spontaneous vaginal delivery) Active Problems:   Encounter for induction of labor  Additional problems: none  Discharge diagnosis: Term Pregnancy Delivered                                              Post partum procedures: none Augmentation: AROM, Pitocin, Cytotec, and IP Foley Complications: None  Intrapartum complications/course: Jamie Collins presented to L&D with elective IOL. She was 1.5/50--3. She progressed  to C/C/+2 with a spontaneous urge to push.  She pushed  effectively over approximately 20 minutes for a spontaneous vaginal birth. Delivery Type: spontaneous vaginal delivery Anesthesia: epidural anesthesia Placenta: spontaneous To Pathology: No  Laceration: 1st degree, 2nd degree, vaginal, and labial Episiotomy: none Newborn Data: Live born female  Birth Weight:  3340g (7lb 5.8oz) APGAR: 9, 9  Newborn Delivery   Birth date/time: 05/24/2023 23:46:00 Delivery  type: Vaginal, Spontaneous      Edinburgh:     05/25/2023    5:58 AM 05/25/2023    3:48 AM 10/11/2022   11:34 AM  Edinburgh Postnatal Depression Scale Screening Tool  I have been able to laugh and see the funny side of things. 0 -- 0  I have looked forward with enjoyment to things. 0  0  I have blamed myself unnecessarily when things went wrong. 2  0  I have been anxious or worried for no good reason. 2  0  I have felt scared or panicky for no good reason. 2  0  Things have been getting on top of me. 1  0  I have been so unhappy that I have had difficulty sleeping. 0  0  I have felt sad or miserable. 1  0  I have been so unhappy that I have been crying. 1  0  The thought of harming myself has occurred to me. 0  0  Edinburgh Postnatal Depression Scale Total 9  0     Hospital course: Induction of Labor With Vaginal Delivery   22 y.o. yo G1P1001 at [redacted]w[redacted]d was admitted to the hospital 05/24/2023 for induction of labor.  Indication for induction: Postdates and Elective.  Patient had an labor course complicated by a category 2 tracing Membrane Rupture Time/Date: 2:55 PM,05/24/2023  Delivery Method:Vaginal, Spontaneous Operative Delivery:N/A Episiotomy: None Lacerations:  Vaginal;Labial;2nd degree Details of delivery can be found in  separate delivery note.  Patient had a postpartum course complicated by none. Patient is discharged home 05/26/23.  Newborn Data: Birth date:05/24/2023 Birth time:11:46 PM Gender:Female Living status:Living Apgars:9 ,9  Weight:3340 g Transfusion:No  Discharge Physical Exam:  BP 111/73 (BP Location: Left Arm)   Pulse 88   Temp 97.8 F (36.6 C) (Oral)   Resp 18   Ht 5\' 5"  (1.651 m)   Wt 81.2 kg   LMP 08/16/2022 (Exact Date)   SpO2 99%   Breastfeeding Unknown   BMI 29.79 kg/m   General: alert and cooperative Pulm:  nl effort ABD: s/nd/nt, fundus firm and below the umbilicus Lochia: appropriate Uterine Fundus: firm Perineum:minimal edema/repair  well approximated Incision: n/a DVT Evaluation: LE non-ttp, no evidence of DVT on exam.  Hemoglobin  Date Value Ref Range Status  05/25/2023 11.3 (L) 12.0 - 15.0 g/dL Final  16/07/9603 54.0 11.1 - 15.9 g/dL Final   HCT  Date Value Ref Range Status  05/25/2023 32.1 (L) 36.0 - 46.0 % Final   Hematocrit  Date Value Ref Range Status  11/07/2022 38.1 34.0 - 46.6 % Final    Risk assessment for postpartum VTE and prophylactic treatment: Very high risk factors: None High risk factors: None Moderate risk factors: None  Postpartum VTE prophylaxis with LMWH not indicated  Disposition: stable, discharge to home. Baby Feeding: breast feeding Baby Disposition: home with mom  Rh Immune globulin indicated: No MMR vaccine given: was not indicated Varivax vaccine given: was not indicated Flu vaccine given in AP setting: No Tdap vaccine given in AP setting: Yes   Discharge home in stable condition Infant Feeding: Breast Infant Disposition:home with mother Discharge instruction: per After Visit Summary and Postpartum booklet. Activity: Advance as tolerated. Pelvic rest for 6 weeks.  Diet: routine diet Anticipated Birth Control: IUD  Follow up Visit:  Follow-up Information     Chari Manning Lucy Lorena, CNM Follow up in 2 week(s).   Specialty: Obstetrics Why: mood check Contact information: [address] [place] [address] [phone]         Sonny Dandy, CNM Follow up in 6 week(s).   Specialty: Obstetrics Why: pp visit and Mirena IUD placement Contact information: [address] [place] [address] [phone]                  Discharge Medications: Allergies as of 05/26/2023       Reactions   Other Other (See Comments)   CHERRIES. CHERRIES - GI Upset   Penicillins Hives        Medication List     TAKE these medications    multivitamin-prenatal 27-0.8 MG Tabs tablet Take 1 tablet by mouth daily at 12  noon.         Follow-up Information     Chari Manning Rolla Plate, CNM Follow up in 2 week(s).   Specialty: Obstetrics Why: mood check Contact information: [address] [place] [address] [phone]         Sonny Dandy, CNM Follow up in 6 week(s).   Specialty: Obstetrics Why: pp visit and Mirena IUD placement Contact information: [address] [place] [address] [phone]                 Signed: Cyril Mourning 05/26/2023, 10:51 AM

## 2023-05-25 NOTE — Anesthesia Postprocedure Evaluation (Signed)
Anesthesia Post Note  Patient: Jamie Collins  Procedure(s) Performed: AN AD HOC LABOR EPIDURAL  Patient location during evaluation: Mother Baby Anesthesia Type: Epidural Level of consciousness: awake and alert Pain management: pain level controlled Vital Signs Assessment: post-procedure vital signs reviewed and stable Respiratory status: spontaneous breathing, nonlabored ventilation and respiratory function stable Cardiovascular status: stable Postop Assessment: no headache, no backache, patient able to bend at knees and able to ambulate Anesthetic complications: no   No notable events documented.   Last Vitals:  Vitals:   05/25/23 0500 05/25/23 0809  BP: 120/74 (!) 99/58  Pulse: 87 90  Resp: 18 20  Temp:  36.7 C  SpO2: 99% 98%    Last Pain:  Vitals:   05/25/23 0809  TempSrc:   PainSc: 0-No pain                 Cleda Mccreedy [name]

## 2023-05-25 NOTE — Plan of Care (Signed)
Pt. Is PP 

## 2023-05-25 NOTE — Progress Notes (Signed)
Postpartum Day  1  Subjective: 22 y.o. G1P1001 postpartum day #1 status post normal spontaneous vaginal delivery. She is ambulating, is tolerating po, is voiding spontaneously.  Her pain is well controlled on PO pain medications. Her lochia is less than menses.  Objective: BP 122/82 (BP Location: Right Arm)   Pulse 92   Temp 98.3 F (36.8 C)   Resp 18   Ht 5\' 5"  (1.651 m)   Wt 81.2 kg   LMP 08/16/2022 (Exact Date)   SpO2 98%   Breastfeeding Unknown   BMI 29.79 kg/m    Physical Exam:  General: alert, cooperative, and appears stated age Breasts: soft/nontender Pulm: nl effort Abdomen: soft, non-tender, active bowel sounds Uterine Fundus: firm Perineum: minimal edema, intact Lochia: appropriate DVT Evaluation: No evidence of DVT seen on physical exam. Negative Homan's sign. No cords or calf tenderness. No significant calf/ankle edema.  Recent Labs    05/24/23 0131 05/25/23 0623  HGB 13.0 11.3*  HCT 37.7 32.1*  WBC 11.8* 17.6*  PLT 285 209    Assessment/Plan: 22 y.o. G1P1001 postpartum day # 1  1. Continue routine postpartum care  2. Infant feeding status: breast and formula feeding --Lactation consult PRN for breastfeeding    3. Contraception plan: IUD  4. Acute blood loss anemia - clinically significant.  --Hemodynamically stable and asymptomatic --Intervention: start on oral supplementation with ferrous sulfate 325 mg   5. Immunization status:   all immunizations up to date    Disposition: continue inpatient postpartum care , plan for discharge home tomorrow    LOS: 1 day   ----- Chari Manning Certified Nurse Midwife [facility]

## 2023-05-25 NOTE — Clinical Social Work Maternal (Signed)
CSW received a consult for Edinburgh score  CSW spoke with RN Darl Pikes prior to meeting with MOB. Per RN,  CSW met with MOB. Explained CSW's role and reason for referral.  MOB reported she is feeling great with no complaints ost delivery. MOB was alert/appropriate/attentive during assessment.   Confirmed contact information for MOB. MOB and Baby will be living with parents and siblings at discharge.   MOB reported she receives WIC/Food Veterinary surgeon and will inform her Workers of Baby's birth. MOB plans to use [facility]. MOB reported she has a crib/b[place]inett/pack and play car seat, clothing, diapers, and all other items needed for Baby. MOB reported she has reliable transportation for herself and Baby. MOB denied resource needs at this time.   MOB reported she has anxiety and depression mental health history MOB reported past mental health treatments. MOB reported she has a good support system and is coping well emotionally at this time. MOB denied SI, HI, or DV. MOB denied the need for mental health support resources at this time, reported she is aware of resources if needed.  CSW provided education and information sheets on PPD and SIDS. MOB verbalized understanding. CSW ecouraged MOB to reach out to her Provider with any questions or needs for support or resources, even after discharge.   MOB denied any needs or questions at this time. CSW encouraged MOB to reach out if any arise prior to discharge.   Please re consult CSW if any additional needs or concerns arise.    Jamie Collins, LCSWA 05/25/2023, 12:16 PM

## 2023-05-26 ENCOUNTER — Ambulatory Visit: Payer: Self-pay

## 2023-05-26 NOTE — Progress Notes (Signed)
Verb understanding of D/C instructions/to home with partner and baby

## 2023-05-26 NOTE — Lactation Note (Signed)
This note was copied from a baby's chart. Lactation Consultation Note  Patient Name: Jamie Collins [id number] Date: 05/26/2023 Age:21 hours Reason for consult: Initial assessment;Primapara;Term;RN request   Maternal Data This is mom's 1st baby, SVD. Mom with history of anxiety and depression.   On first visit mom reports baby with latch difficulty and so mom is bottle feeding.Per mom her intention was to combination feed. At this time mom declined assistance with breastfeeding but was open to breastpumping. Mom has a DEBP at home. Assisted mom with pumping. Has patient been taught Hand Expression?: Yes Does the patient have breastfeeding experience prior to this delivery?: No  Feeding Mother's Current Feeding Choice: Breast Milk and Formula  Lactation Tools Discussed/Used Tools: Pump Breast pump type: Double-Electric Breast Pump Pump Education: Setup, frequency, and cleaning;Milk Storage Reason for Pumping: Mom requested to pump to provide milk and to keep option open to breastfeed. Pumping frequency: Goal 8 times/24 hours to maximize milk production. Pumped volume: 17 mL  Interventions Interventions: DEBP;Education  Discharge Discharge Education: Engorgement and breast care;Warning signs for feeding baby;Outpatient recommendation Pump: Personal  Consult Status Consult Status: Complete Date: 05/26/23 Follow-up type: In-patient  Update provided to care nurse.  Fuller Song 05/26/2023, 4:47 PM

## 2023-06-03 ENCOUNTER — Telehealth: Payer: Self-pay

## 2023-06-03 NOTE — Telephone Encounter (Signed)
[facility]- Discharge Call Backs-Left Voicemail about the following below. 1-Do you have any questions or concerns about yourself as you heal?  C-Sec-Is your dressing off?/Vag Del? 2-Any concerns or questions about your baby? Is your baby eating, peeing,pooping well? 3-Where does your baby sleep in your home? Reviewed ABC's of safe sleep. 4-How was your stay at the hospital? 5- Did our team work together to care for you? You should be receiving a survey in the mail soon.   We would really appreciate it if you could fill that out for us and return it in the mail.  We value the feedback to make improvements and continue the great work we do.   If you have any questions please feel free to call me back at [phone]
